# Patient Record
Sex: Female | Born: 1990 | Race: Black or African American | Hispanic: No | Marital: Single | Smoking: Never smoker
Health system: Southern US, Community
[De-identification: ages and names within clinical notes are randomized; demographics above are authoritative.]

## PROBLEM LIST (undated history)

## (undated) DIAGNOSIS — N289 Disorder of kidney and ureter, unspecified: Secondary | ICD-10-CM

## (undated) DIAGNOSIS — J45909 Unspecified asthma, uncomplicated: Secondary | ICD-10-CM

## (undated) HISTORY — PX: BREAST SURGERY: SHX581

---

## 2019-04-30 DIAGNOSIS — J302 Other seasonal allergic rhinitis: Secondary | ICD-10-CM | POA: Insufficient documentation

## 2019-04-30 DIAGNOSIS — G43109 Migraine with aura, not intractable, without status migrainosus: Secondary | ICD-10-CM | POA: Insufficient documentation

## 2019-04-30 DIAGNOSIS — J4520 Mild intermittent asthma, uncomplicated: Secondary | ICD-10-CM | POA: Insufficient documentation

## 2019-12-24 DIAGNOSIS — L309 Dermatitis, unspecified: Secondary | ICD-10-CM | POA: Insufficient documentation

## 2021-02-17 DIAGNOSIS — R002 Palpitations: Secondary | ICD-10-CM | POA: Insufficient documentation

## 2021-02-17 DIAGNOSIS — R030 Elevated blood-pressure reading, without diagnosis of hypertension: Secondary | ICD-10-CM | POA: Insufficient documentation

## 2021-02-17 HISTORY — DX: Elevated blood-pressure reading, without diagnosis of hypertension: R03.0

## 2021-05-23 ENCOUNTER — Emergency Department (HOSPITAL_COMMUNITY)
Admission: EM | Admit: 2021-05-23 | Discharge: 2021-05-23 | Disposition: A | Discharge status: Home / Self Care | Payer: No Typology Code available for payment source | Attending: Emergency Medicine | Admitting: Emergency Medicine

## 2021-05-23 ENCOUNTER — Encounter (HOSPITAL_COMMUNITY): Payer: Self-pay

## 2021-05-23 ENCOUNTER — Other Ambulatory Visit: Payer: Self-pay

## 2021-05-23 ENCOUNTER — Emergency Department (HOSPITAL_COMMUNITY): Payer: No Typology Code available for payment source

## 2021-05-23 ENCOUNTER — Other Ambulatory Visit (HOSPITAL_COMMUNITY): Payer: Self-pay

## 2021-05-23 DIAGNOSIS — N9489 Other specified conditions associated with female genital organs and menstrual cycle: Secondary | ICD-10-CM | POA: Diagnosis not present

## 2021-05-23 DIAGNOSIS — R109 Unspecified abdominal pain: Secondary | ICD-10-CM

## 2021-05-23 DIAGNOSIS — R1030 Lower abdominal pain, unspecified: Secondary | ICD-10-CM | POA: Insufficient documentation

## 2021-05-23 LAB — CBC WITH DIFFERENTIAL/PLATELET
Abs Immature Granulocytes: 0.03 10*3/uL (ref 0.00–0.07)
Basophils Absolute: 0 10*3/uL (ref 0.0–0.1)
Basophils Relative: 1 %
Eosinophils Absolute: 0.3 10*3/uL (ref 0.0–0.5)
Eosinophils Relative: 4 %
HCT: 40.4 % (ref 36.0–46.0)
Hemoglobin: 13.3 g/dL (ref 12.0–15.0)
Immature Granulocytes: 0 %
Lymphocytes Relative: 30 %
Lymphs Abs: 2.4 10*3/uL (ref 0.7–4.0)
MCH: 29.5 pg (ref 26.0–34.0)
MCHC: 32.9 g/dL (ref 30.0–36.0)
MCV: 89.6 fL (ref 80.0–100.0)
Monocytes Absolute: 0.6 10*3/uL (ref 0.1–1.0)
Monocytes Relative: 8 %
Neutro Abs: 4.6 10*3/uL (ref 1.7–7.7)
Neutrophils Relative %: 57 %
Platelets: 283 10*3/uL (ref 150–400)
RBC: 4.51 MIL/uL (ref 3.87–5.11)
RDW: 13.2 % (ref 11.5–15.5)
WBC: 8 10*3/uL (ref 4.0–10.5)
nRBC: 0 % (ref 0.0–0.2)

## 2021-05-23 LAB — COMPREHENSIVE METABOLIC PANEL
ALT: 17 U/L (ref 0–44)
AST: 20 U/L (ref 15–41)
Albumin: 4.4 g/dL (ref 3.5–5.0)
Alkaline Phosphatase: 46 U/L (ref 38–126)
Anion gap: 8 (ref 5–15)
BUN: 9 mg/dL (ref 6–20)
CO2: 26 mmol/L (ref 22–32)
Calcium: 9.2 mg/dL (ref 8.9–10.3)
Chloride: 106 mmol/L (ref 98–111)
Creatinine, Ser: 0.71 mg/dL (ref 0.44–1.00)
GFR, Estimated: >60 mL/min (ref >60)
Glucose, Bld: 102 mg/dL — ABNORMAL HIGH (ref 70–99)
Potassium: 3.9 mmol/L (ref 3.5–5.1)
Sodium: 140 mmol/L (ref 135–145)
Total Bilirubin: 0.6 mg/dL (ref 0.3–1.2)
Total Protein: 7.7 g/dL (ref 6.5–8.1)

## 2021-05-23 LAB — URINALYSIS, ROUTINE W REFLEX MICROSCOPIC
Bilirubin Urine: NEGATIVE
Glucose, UA: NEGATIVE mg/dL
Hgb urine dipstick: NEGATIVE
Ketones, ur: NEGATIVE mg/dL
Leukocytes,Ua: NEGATIVE
Nitrite: NEGATIVE
Protein, ur: NEGATIVE mg/dL
Specific Gravity, Urine: 1.01 (ref 1.005–1.030)
pH: 7 (ref 5.0–8.0)

## 2021-05-23 LAB — HCG, QUANTITATIVE, PREGNANCY: hCG, Beta Chain, Quant, S: <1 m[IU]/mL (ref <5)

## 2021-05-23 LAB — LIPASE, BLOOD: Lipase: 32 U/L (ref 11–51)

## 2021-05-23 LAB — I-STAT BETA HCG BLOOD, ED (MC, WL, AP ONLY): I-stat hCG, quantitative: 9.2 m[IU]/mL — ABNORMAL HIGH (ref <5)

## 2021-05-23 LAB — PREGNANCY, URINE: Preg Test, Ur: NEGATIVE

## 2021-05-23 MED ORDER — SODIUM CHLORIDE 0.9 % IV SOLN
12.5000 mg | Freq: Four times a day (QID) | INTRAVENOUS | Status: DC | PRN
  Administered 2021-05-23: 12.5 mg via INTRAVENOUS
  Filled 2021-05-23 (×2): qty 0.5

## 2021-05-23 MED ORDER — MORPHINE SULFATE (PF) 4 MG/ML IV SOLN
4.0000 mg | Freq: Once | INTRAVENOUS | Status: AC
  Administered 2021-05-23: 4 mg via INTRAVENOUS
  Filled 2021-05-23: qty 1

## 2021-05-23 MED ORDER — IOHEXOL 350 MG/ML SOLN
100.0000 mL | Freq: Once | INTRAVENOUS | Status: AC | PRN
  Administered 2021-05-23: 75 mL via INTRAVENOUS

## 2021-05-23 MED ORDER — PROMETHAZINE HCL 25 MG PO TABS
25.0000 mg | ORAL_TABLET | Freq: Four times a day (QID) | ORAL | 0 refills | Status: DC | PRN
  Filled 2021-05-23: qty 5, 2d supply, fill #0

## 2021-05-23 MED ORDER — ONDANSETRON HCL 4 MG/2ML IJ SOLN
4.0000 mg | Freq: Once | INTRAMUSCULAR | Status: DC
  Filled 2021-05-23: qty 2

## 2021-05-23 MED ORDER — NAPROXEN 500 MG PO TABS
500.0000 mg | ORAL_TABLET | Freq: Two times a day (BID) | ORAL | 0 refills | Status: DC
  Filled 2021-05-23: qty 30, 15d supply, fill #0

## 2021-05-23 NOTE — ED Triage Notes (Signed)
Pt complains of sharp abdominal pain x 3 days.

## 2021-05-23 NOTE — ED Notes (Signed)
Pt urine specimen/UA obtained, placed in triage urine bin, holding for order. 

## 2021-05-23 NOTE — Discharge Instructions (Addendum)
Take medications as needed. Follow-up with your primary care provider. You have a small kidney stone in your kidney that may cause pain in the future. Return to the ER for worsening pain, bloody stools, continued vomiting.

## 2021-05-23 NOTE — ED Provider Notes (Signed)
[facility]ENCY [facility]PT Provider Note   CSN: [id number] Arrival date & time: 05/23/21  0042     History Chief Complaint  Patient presents with   Abdominal Pain    Alyssa Stokes is a 30 y.o. female.  The history is provided by the patient. No language interpreter was used.  Abdominal Pain  30 year old female who presents for evaluation of abdominal pain patient report for the past 3 days she has been experiencing pain to her lower abdomen.  Pain is described as a sharp sensation across her abdomen radiates up toward and to her back.  Pain at times very intense which makes her nauseous.  At this time pain is moderate to severe nothing seems make it better or worse.  No report of any fever chills chest pain shortness of breath productive cough patient mention she has not been sexually active for nearly 4 months.  Her period is irregular.  She is unable to recall last menstruation.  History reviewed. No pertinent past medical history.  There are no problems to display for this patient.   History reviewed. No pertinent surgical history.   OB History   No obstetric history on file.     History reviewed. No pertinent family history.     Home Medications Prior to Admission medications   Not on File    Allergies    Patient has no allergy information on record.  Review of Systems   Review of Systems  Gastrointestinal:  Positive for abdominal pain.  All other systems reviewed and are negative.  Physical Exam Updated Vital Signs BP (!) 149/98 (BP Location: Left Arm)   Pulse 99   Temp 98.1 F (36.7 C) (Oral)   Resp 18   Ht 5\' 6"  (1.676 m)   Wt 66.7 kg   SpO2 100%   BMI 23.73 kg/m   Physical Exam Vitals and nursing note reviewed.  Constitutional:      Appearance: She is well-developed.     Comments: Patient is tearful and looking uncomfortable  HENT:     Head: Atraumatic.  Eyes:     Conjunctiva/sclera: Conjunctivae normal.   Cardiovascular:     Rate and Rhythm: Normal rate and regular rhythm.  Pulmonary:     Effort: Pulmonary effort is normal.  Abdominal:     General: Abdomen is flat. Bowel sounds are normal.     Palpations: Abdomen is soft.     Tenderness: There is abdominal tenderness.  Musculoskeletal:     Cervical back: Neck supple.  Skin:    Findings: No rash.  Neurological:     Mental Status: She is alert.  Psychiatric:        Mood and Affect: Mood normal.    ED Results / Procedures / Treatments   Labs (all labs ordered are listed, but only abnormal results are displayed) Labs Reviewed  COMPREHENSIVE METABOLIC PANEL - Abnormal; Notable for the following components:      Result Value   Glucose, Bld 102 (*)    All other components within normal limits  URINALYSIS, ROUTINE W REFLEX MICROSCOPIC - Abnormal; Notable for the following components:   Color, Urine STRAW (*)    All other components within normal limits  I-STAT BETA HCG BLOOD, ED ([facility], [facility], [facility] ONLY) - Abnormal; Notable for the following components:   I-stat hCG, quantitative 9.2 (*)    All other components within normal limits  CBC WITH DIFFERENTIAL/PLATELET  LIPASE, BLOOD  HCG, QUANTITATIVE, PREGNANCY  EKG None  Radiology No results found.  Procedures Procedures   Medications Ordered in ED Medications  morphine 4 MG/ML injection 4 mg (has no administration in time range)  promethazine (PHENERGAN) 12.5 mg in sodium chloride 0.9 % 50 mL IVPB (has no administration in time range)    ED Course  I have reviewed the triage vital signs and the nursing notes.  Pertinent labs & imaging results that were available during my care of the patient were reviewed by me and considered in my medical decision making (see chart for details).    MDM Rules/Calculators/A&P                           BP (!) 131/92   Pulse 83   Temp 98 F (36.7 C) (Oral)   Resp 19   Ht 5\' 6"  (1.676 m)   Wt 66.7 kg   SpO2 100%   BMI 23.73 kg/m    Final Clinical Impression(s) / ED Diagnoses Final diagnoses:  None    Rx / DC Orders ED Discharge Orders     None      Patient endorsed progressive worsening lower abdominal pain for the past 3 days.  She appears uncomfortable, and does have reproducible abdominal tenderness on exam.  No CVA tenderness to suggest kidney stone.  Unsure last pregnancy status therefore it would be prudent to check U-preg  6:00 AM I-STAT beta-hCG is elevated at 9.2.  Since patient states she is not sexually active, will get confirmatory pregnancy test. Patient is still feeling uncomfortable and nauseous.  She is unable to tolerate Zofran due to prior allergic reaction.  We will give Phenergan.  If her pregnancy test is negative and her symptoms still persist, she may benefit from an abdominal pelvis CT scan for further evaluation to rule appendicitis.  6:49 AM Pt sign out to oncoming provider who will f/u on lab result, reassess and determine the need for additional imaging.    , PA-C 05/23/21 07/23/21    [id number], MD 05/23/21 [phone]

## 2021-05-23 NOTE — ED Provider Notes (Signed)
  Physical Exam  BP 123/79   Pulse 84   Temp 98 F (36.7 C) (Oral)   Resp 15   Ht 5\' 6"  (1.676 m)   Wt 66.7 kg   SpO2 100%   BMI 23.73 kg/m   Physical Exam Vitals and nursing note reviewed.  Constitutional:      General: She is not in acute distress.    Appearance: She is well-developed. She is not diaphoretic.  HENT:     Head: Normocephalic and atraumatic.  Eyes:     General: No scleral icterus.    Conjunctiva/sclera: Conjunctivae normal.  Pulmonary:     Effort: Pulmonary effort is normal. No respiratory distress.  Musculoskeletal:     Cervical back: Normal range of motion.  Skin:    Findings: No rash.  Neurological:     Mental Status: She is alert.    ED Course/Procedures   Clinical Course as of 05/23/21 1036  Tue May 23, 2021  0856 I-stat hCG, quantitative(!): 9.2 [HK]  0856 Preg Test, Ur: NEGATIVE [HK]  0856 CT tech states that we will need a negative lab hCG to verify before she can get a CT of her abdomen pelvis. [HK]  0856 HCG, Beta Chain, Quant, S: <1 [HK]    Clinical Course User Index [HK] May 25, 2021, PA-C    Procedures  MDM  Care handed off from previous provider.  30 year old female presenting to the ED for lower abdominal pain.  She is pending a CT of the abdomen pelvis to rule out structural cause.  10:31 AM CT is negative for acute abnormality.  Will discharge home with symptomatic treatment.  Improved here.  Remains hemodynamically stable.  Return precautions given.    Patient is hemodynamically stable, in NAD, and able to ambulate in the ED. Evaluation does not show pathology that would require ongoing emergent intervention or inpatient treatment. I explained the diagnosis to the patient. Pain has been managed and has no complaints prior to discharge. Patient is comfortable with above plan and is stable for discharge at this time. All questions were answered prior to disposition. Strict return precautions for returning to the ED were discussed.  Encouraged follow up with PCP.   An After Visit Summary was printed and given to the patient.   Portions of this note were generated with 38. Dictation errors may occur despite best attempts at proofreading.     Scientist, clinical (histocompatibility and immunogenetics), PA-C 05/23/21 1036    07/23/21, MD 05/28/21 [phone]

## 2021-05-23 NOTE — ED Notes (Signed)
Pt ambulatory in ED lobby. 

## 2021-05-24 DIAGNOSIS — Z87442 Personal history of urinary calculi: Secondary | ICD-10-CM | POA: Insufficient documentation

## 2021-06-15 ENCOUNTER — Ambulatory Visit (HOSPITAL_COMMUNITY)
Admission: EM | Admit: 2021-06-15 | Discharge: 2021-06-15 | Disposition: A | Discharge status: Home / Self Care | Payer: No Typology Code available for payment source | Attending: Physician Assistant | Admitting: Physician Assistant

## 2021-06-15 ENCOUNTER — Other Ambulatory Visit: Payer: Self-pay

## 2021-06-15 ENCOUNTER — Encounter (HOSPITAL_COMMUNITY): Payer: Self-pay

## 2021-06-15 DIAGNOSIS — R102 Pelvic and perineal pain: Secondary | ICD-10-CM

## 2021-06-15 DIAGNOSIS — R1030 Lower abdominal pain, unspecified: Secondary | ICD-10-CM | POA: Diagnosis not present

## 2021-06-15 LAB — POCT URINALYSIS DIPSTICK, ED / UC
Glucose, UA: 250 mg/dL — AB
Hgb urine dipstick: NEGATIVE
Ketones, ur: 15 mg/dL — AB
Nitrite: POSITIVE — AB
Protein, ur: 100 mg/dL — AB
Specific Gravity, Urine: 1.015 (ref 1.005–1.030)
Urobilinogen, UA: 4 mg/dL — ABNORMAL HIGH (ref 0.0–1.0)
pH: 5 (ref 5.0–8.0)

## 2021-06-15 LAB — POC URINE PREG, ED: Preg Test, Ur: NEGATIVE

## 2021-06-15 MED ORDER — SULFAMETHOXAZOLE-TRIMETHOPRIM 800-160 MG PO TABS: 1.0000 | ORAL_TABLET | Freq: Two times a day (BID) | ORAL | 0 refills | Status: AC

## 2021-06-15 MED ORDER — OXYBUTYNIN CHLORIDE 5 MG PO TABS: 5.0000 mg | ORAL_TABLET | Freq: Every day | ORAL | 0 refills | Status: DC | PRN

## 2021-06-15 NOTE — Discharge Instructions (Addendum)
We will contact you if we need to arrange any additional treatment based on your urine culture and swab result.  Please start Bactrim DS to treat urinary tract infection.  If you develop any oral lesions or rash please stop the medication and be seen immediately.  You can use oxybutynin for bladder spasms but be careful not to use this too regularly as it can cause urinary retention.  Make sure you are drinking plenty of fluid.  If you have any worsening symptoms you need to go to the emergency room for imaging as we discussed.  Follow-up with urology as scheduled.

## 2021-06-15 NOTE — ED Triage Notes (Signed)
Pt presents with lower abdominal pain x 08/09. States she has had ultrasounds and scans. States nothing has resolved. States her bladder feels heavy and states she trouble urinating. States she got off her Nexplanon since June and states she has not had a cycle since then.

## 2021-06-15 NOTE — ED Provider Notes (Signed)
[facility]    CSN: [id number] Arrival date & time: 06/15/21  1935      History   Chief Complaint Chief Complaint  Patient presents with   Pelvic Pain    HPI Alyssa Stokes is a 30 y.o. female.   Patient presents today with a several week history of ongoing lower abdominal pain.  She was seen in the emergency room on 05/23/2021 at which point CT scan showed no acute abnormality to explain patient's pain.  She then contacted her primary care provider who prescribed Keflex to cover for UTI.  She had also been taking Azo and reports that symptoms did improve with regular Azo use but since she stopped this medication her symptoms recurred.  Pain is rated 10 on a 0-10 pain scale, localized to lower abdomen, described as intense pressure, worse with ambulation or micturition, no alleviating factors identified.  She denies any dysuria but does report urinary frequency and urgency.  She has not been sexually active in the past few months but has not been tested for STIs and is open to this today.  She denies any vaginal symptoms including bleeding, discharge, increased pain.  She is scheduled to see a urologist in approximately a week but has not seen specialist at this time.  She has a history of nephrolithiasis but states current symptoms are not similar to previous episodes of this condition.   History reviewed. No pertinent past medical history.  There are no problems to display for this patient.   History reviewed. No pertinent surgical history.  OB History   No obstetric history on file.      Home Medications    Prior to Admission medications   Medication Sig Start Date End Date Taking? Authorizing Provider  oxybutynin (DITROPAN) 5 MG tablet Take 1 tablet (5 mg total) by mouth daily as needed for bladder spasms. 06/15/21  Yes Cora Stetson K, PA-C  sulfamethoxazole-trimethoprim (BACTRIM DS) 800-160 MG tablet Take 1 tablet by mouth 2 (two) times daily for 7  days. 06/15/21 06/22/21 Yes Raina Sole, Noberto Retort, PA-C  acetaminophen (TYLENOL) 500 MG tablet Take 1,000 mg by mouth every 6 (six) hours as needed for mild pain.    [provider]  albuterol (VENTOLIN HFA) 108 (90 Base) MCG/ACT inhaler Inhale 2 puffs into the lungs every 6 (six) hours as needed for wheezing or shortness of breath.    [provider]  cetirizine (ZYRTEC) 10 MG tablet Take 10 mg by mouth daily.    [provider]  ibuprofen (ADVIL) 200 MG tablet Take 600 mg by mouth every 6 (six) hours as needed for mild pain.    [provider]  naproxen (NAPROSYN) 500 MG tablet Take 1 tablet by mouth 2 times daily. 05/23/21   Khatri, Hina, PA-C  promethazine (PHENERGAN) 25 MG tablet Take 1 tablet  by mouth every 6  hours as needed for nausea or vomiting. 05/23/21   Dietrich Pates, PA-C    Family History History reviewed. No pertinent family history.  Social History Social History   Tobacco Use   Smoking status: Never   Smokeless tobacco: Never  Vaping Use   Vaping Use: Never used  Substance Use Topics   Alcohol use: Yes   Drug use: Never     Allergies   Ondansetron, Avocado, Banana, and Benadryl [diphenhydramine]   Review of Systems Review of Systems  Constitutional:  Negative for activity change, appetite change, fatigue and fever.  Respiratory:  Negative for cough  and shortness of breath.   Cardiovascular:  Negative for chest pain.  Gastrointestinal:  Positive for abdominal pain. Negative for diarrhea, nausea and vomiting.  Genitourinary:  Positive for frequency, pelvic pain and urgency. Negative for dysuria, vaginal bleeding, vaginal discharge and vaginal pain.  Neurological:  Negative for dizziness, light-headedness and headaches.    Physical Exam Triage Vital Signs ED Triage Vitals  Enc Vitals Group     BP 06/15/21 2010 134/78     Pulse Rate 06/15/21 2009 91     Resp 06/15/21 2009 19     Temp 06/15/21 2009 98.1 F (36.7 C)     Temp Source  06/15/21 2009 Oral     SpO2 06/15/21 2009 99 %     Weight --      Height --      Head Circumference --      Peak Flow --      Pain Score 06/15/21 2007 10     Pain Loc --      Pain Edu? --      Excl. in GC? --    No data found.  Updated Vital Signs BP 134/78   Pulse 91   Temp 98.1 F (36.7 C) (Oral)   Resp 19   LMP  (LMP Unknown)   SpO2 99%   Visual Acuity Right Eye Distance:   Left Eye Distance:   Bilateral Distance:    Right Eye Near:   Left Eye Near:    Bilateral Near:     Physical Exam Vitals reviewed. Exam conducted with a chaperone present.  Constitutional:      General: She is awake. She is not in acute distress.    Appearance: Normal appearance. She is normal weight. She is not ill-appearing.     Comments: Very pleasant female appears stated age in no acute distress sitting comfortably in exam room  HENT:     Head: Normocephalic and atraumatic.  Cardiovascular:     Rate and Rhythm: Normal rate and regular rhythm.     Heart sounds: Normal heart sounds, S1 normal and S2 normal. No murmur heard. Pulmonary:     Effort: Pulmonary effort is normal.     Breath sounds: Normal breath sounds. No wheezing, rhonchi or rales.     Comments: Clear to auscultation bilaterally Abdominal:     General: Bowel sounds are normal.     Palpations: Abdomen is soft.     Tenderness: There is abdominal tenderness in the right lower quadrant, suprapubic area and left lower quadrant. There is no right CVA tenderness, left CVA tenderness, guarding or rebound.     Comments: Significant tenderness to palpation throughout lower abdomen.   Genitourinary:    Labia:        Right: No rash or tenderness.        Left: No rash or tenderness.      Vagina: Normal.     Cervix: No cervical motion tenderness.     Uterus: Normal.      Comments: [name], RN present as chaperone during exam.  No cervical motion tenderness or evidence of PID on exam. Psychiatric:        Behavior: Behavior is  cooperative.     UC Treatments / Results  Labs (all labs ordered are listed, but only abnormal results are displayed) Labs Reviewed  POCT URINALYSIS DIPSTICK, ED / UC - Abnormal; Notable for the following components:      Result Value   Glucose, UA 250 (*)  Bilirubin Urine SMALL (*)    Ketones, ur 15 (*)    Protein, ur 100 (*)    Urobilinogen, UA 4.0 (*)    Nitrite POSITIVE (*)    Leukocytes,Ua LARGE (*)    All other components within normal limits  URINE CULTURE  POC URINE PREG, ED  CERVICOVAGINAL ANCILLARY ONLY    EKG   Radiology No results found.  Procedures Procedures (including critical care time)  Medications Ordered in UC Medications - No data to display  Initial Impression / Assessment and Plan / UC Course  I have reviewed the triage vital signs and the nursing notes.  Pertinent labs & imaging results that were available during my care of the patient were reviewed by me and considered in my medical decision making (see chart for details).      Discussed with patient that we do not have capabilities for imaging including ultrasound or CT in clinic.  Patient is understanding going to the emergency room as she has already been there and had negative CT scan.  Discussed that if symptoms persist or worsen she should go to the emergency room for imaging to which she expressed understanding.  UA was unable to read due to recent Azo use but will empirically treat for urinary tract infection with Bactrim DS.  Urine culture was obtained and we discussed potential need to change antibiotics based on susceptibilities identified on culture.  STI swab was collected-results pending.  No evidence of PID on pelvic exam so we will defer additional antibiotic treatment for the time being.  Urine pregnancy was negative.  Patient reports that her pain is worse with micturition and she feels intense bladder spasms; she has failed Azo so we will try very low-dose oxybutynin but we  discussed potential risk of urinary retention and encouraged her to be evaluated immediately with any decreased urination.  She is scheduled to see urology next week, strongly encouraged to keep this appointment.  We discussed that if she has any worsening symptoms in the meantime she is to go to the emergency room.  Strict return precautions given to which patient expressed understanding.  Final Clinical Impressions(s) / UC Diagnoses   Final diagnoses:  Lower abdominal pain  Pelvic pain     Discharge Instructions      We will contact you if we need to arrange any additional treatment based on your urine culture and swab result.  Please start Bactrim DS to treat urinary tract infection.  If you develop any oral lesions or rash please stop the medication and be seen immediately.  You can use oxybutynin for bladder spasms but be careful not to use this too regularly as it can cause urinary retention.  Make sure you are drinking plenty of fluid.  If you have any worsening symptoms you need to go to the emergency room for imaging as we discussed.  Follow-up with urology as scheduled.     ED Prescriptions     Medication Sig Dispense Auth. Provider   sulfamethoxazole-trimethoprim (BACTRIM DS) 800-160 MG tablet Take 1 tablet by mouth 2 (two) times daily for 7 days. 14 tablet Aeon Kessner K, PA-C   oxybutynin (DITROPAN) 5 MG tablet Take 1 tablet (5 mg total) by mouth daily as needed for bladder spasms. 10 tablet Elio Haden, Noberto Retort, PA-C      PDMP not reviewed this encounter.   Jeani Hawking, PA-C 06/15/21 2053

## 2021-06-16 ENCOUNTER — Other Ambulatory Visit: Payer: Self-pay

## 2021-06-16 ENCOUNTER — Emergency Department (HOSPITAL_COMMUNITY): Payer: No Typology Code available for payment source

## 2021-06-16 ENCOUNTER — Encounter (HOSPITAL_COMMUNITY): Payer: Self-pay

## 2021-06-16 ENCOUNTER — Emergency Department (HOSPITAL_COMMUNITY)
Admission: EM | Admit: 2021-06-16 | Discharge: 2021-06-16 | Disposition: A | Discharge status: Home / Self Care | Payer: No Typology Code available for payment source | Attending: Emergency Medicine | Admitting: Emergency Medicine

## 2021-06-16 ENCOUNTER — Telehealth (HOSPITAL_COMMUNITY): Payer: Self-pay

## 2021-06-16 DIAGNOSIS — B9689 Other specified bacterial agents as the cause of diseases classified elsewhere: Secondary | ICD-10-CM | POA: Diagnosis not present

## 2021-06-16 DIAGNOSIS — R1032 Left lower quadrant pain: Secondary | ICD-10-CM | POA: Diagnosis not present

## 2021-06-16 DIAGNOSIS — N390 Urinary tract infection, site not specified: Secondary | ICD-10-CM | POA: Diagnosis not present

## 2021-06-16 DIAGNOSIS — R1031 Right lower quadrant pain: Secondary | ICD-10-CM | POA: Insufficient documentation

## 2021-06-16 DIAGNOSIS — R300 Dysuria: Secondary | ICD-10-CM | POA: Diagnosis present

## 2021-06-16 DIAGNOSIS — J45909 Unspecified asthma, uncomplicated: Secondary | ICD-10-CM | POA: Insufficient documentation

## 2021-06-16 HISTORY — DX: Unspecified asthma, uncomplicated: J45.909

## 2021-06-16 HISTORY — DX: Disorder of kidney and ureter, unspecified: N28.9

## 2021-06-16 LAB — COMPREHENSIVE METABOLIC PANEL
ALT: 17 U/L (ref 0–44)
AST: 17 U/L (ref 15–41)
Albumin: 4.1 g/dL (ref 3.5–5.0)
Alkaline Phosphatase: 38 U/L (ref 38–126)
Anion gap: 5 (ref 5–15)
BUN: 7 mg/dL (ref 6–20)
CO2: 25 mmol/L (ref 22–32)
Calcium: 9.1 mg/dL (ref 8.9–10.3)
Chloride: 108 mmol/L (ref 98–111)
Creatinine, Ser: 0.74 mg/dL (ref 0.44–1.00)
GFR, Estimated: >60 mL/min (ref >60)
Glucose, Bld: 92 mg/dL (ref 70–99)
Potassium: 4.2 mmol/L (ref 3.5–5.1)
Sodium: 138 mmol/L (ref 135–145)
Total Bilirubin: 0.4 mg/dL (ref 0.3–1.2)
Total Protein: 7.4 g/dL (ref 6.5–8.1)

## 2021-06-16 LAB — CBC WITH DIFFERENTIAL/PLATELET
Abs Immature Granulocytes: 0.01 10*3/uL (ref 0.00–0.07)
Basophils Absolute: 0 10*3/uL (ref 0.0–0.1)
Basophils Relative: 0 %
Eosinophils Absolute: 0.3 10*3/uL (ref 0.0–0.5)
Eosinophils Relative: 4 %
HCT: 36.1 % (ref 36.0–46.0)
Hemoglobin: 11.8 g/dL — ABNORMAL LOW (ref 12.0–15.0)
Immature Granulocytes: 0 %
Lymphocytes Relative: 24 %
Lymphs Abs: 1.8 10*3/uL (ref 0.7–4.0)
MCH: 29.2 pg (ref 26.0–34.0)
MCHC: 32.7 g/dL (ref 30.0–36.0)
MCV: 89.4 fL (ref 80.0–100.0)
Monocytes Absolute: 0.7 10*3/uL (ref 0.1–1.0)
Monocytes Relative: 9 %
Neutro Abs: 4.7 10*3/uL (ref 1.7–7.7)
Neutrophils Relative %: 63 %
Platelets: 275 10*3/uL (ref 150–400)
RBC: 4.04 MIL/uL (ref 3.87–5.11)
RDW: 14 % (ref 11.5–15.5)
WBC: 7.5 10*3/uL (ref 4.0–10.5)
nRBC: 0 % (ref 0.0–0.2)

## 2021-06-16 LAB — CERVICOVAGINAL ANCILLARY ONLY
Bacterial Vaginitis (gardnerella): NEGATIVE
Candida Glabrata: NEGATIVE
Candida Vaginitis: NEGATIVE
Chlamydia: NEGATIVE
Comment: NEGATIVE
Comment: NEGATIVE
Comment: NEGATIVE
Comment: NEGATIVE
Comment: NEGATIVE
Comment: NORMAL
Neisseria Gonorrhea: NEGATIVE
Trichomonas: NEGATIVE

## 2021-06-16 LAB — URINALYSIS, ROUTINE W REFLEX MICROSCOPIC
Bilirubin Urine: NEGATIVE
Glucose, UA: NEGATIVE mg/dL
Ketones, ur: NEGATIVE mg/dL
Nitrite: NEGATIVE
Protein, ur: NEGATIVE mg/dL
Specific Gravity, Urine: <1.005 — ABNORMAL LOW (ref 1.005–1.030)
pH: 6 (ref 5.0–8.0)

## 2021-06-16 LAB — I-STAT BETA HCG BLOOD, ED (MC, WL, AP ONLY): I-stat hCG, quantitative: <5 m[IU]/mL (ref <5)

## 2021-06-16 LAB — LIPASE, BLOOD: Lipase: 28 U/L (ref 11–51)

## 2021-06-16 MED ORDER — KETOROLAC TROMETHAMINE 15 MG/ML IJ SOLN
15.0000 mg | Freq: Once | INTRAMUSCULAR | Status: AC
  Administered 2021-06-16: 15 mg via INTRAVENOUS
  Filled 2021-06-16: qty 1

## 2021-06-16 NOTE — Telephone Encounter (Signed)
Pt was informed she can keep using the Bactrim and we are still waiting for the urine culture.

## 2021-06-16 NOTE — ED Provider Notes (Signed)
[facility]-EMERGENCY DEPT Provider Note   CSN: [id number] Arrival date & time: 06/16/21  2376     History Chief Complaint  Patient presents with   Abdominal Pain    Alyssa Stokes is a 30 y.o. female.   Abdominal Pain Pain location:  RLQ, LLQ and suprapubic Pain quality: aching   Pain radiates to:  Does not radiate Pain severity:  Mild Onset quality:  Gradual Duration:  1 week Timing:  Intermittent Progression:  Waxing and waning Chronicity:  New Context comment:  Hx of kidney stone, dx with UTI yesterday on abx but ongoing pain Relieved by:  Nothing Worsened by:  Nothing Associated symptoms: dysuria   Associated symptoms: no chest pain, no chills, no cough, no fever, no hematuria, no shortness of breath, no sore throat, no vaginal bleeding, no vaginal discharge and no vomiting   Risk factors: has not had multiple surgeries       Past Medical History:  Diagnosis Date   Asthma    Renal disorder     There are no problems to display for this patient.   Past Surgical History:  Procedure Laterality Date   BREAST SURGERY       OB History   No obstetric history on file.     Family History  Problem Relation Age of Onset   Healthy Mother    Healthy Father     Social History   Tobacco Use   Smoking status: Never   Smokeless tobacco: Never  Vaping Use   Vaping Use: Never used  Substance Use Topics   Alcohol use: Yes   Drug use: Never    Home Medications Prior to Admission medications   Medication Sig Start Date End Date Taking? Authorizing Provider  acetaminophen (TYLENOL) 500 MG tablet Take 1,000 mg by mouth every 6 (six) hours as needed for mild pain.    [provider]  albuterol (VENTOLIN HFA) 108 (90 Base) MCG/ACT inhaler Inhale 2 puffs into the lungs every 6 (six) hours as needed for wheezing or shortness of breath.    [provider]  cetirizine (ZYRTEC) 10 MG tablet Take 10 mg by mouth  daily.    [provider]  ibuprofen (ADVIL) 200 MG tablet Take 600 mg by mouth every 6 (six) hours as needed for mild pain.    [provider]  naproxen (NAPROSYN) 500 MG tablet Take 1 tablet by mouth 2 times daily. 05/23/21   Khatri, Hina, PA-C  oxybutynin (DITROPAN) 5 MG tablet Take 1 tablet (5 mg total) by mouth daily as needed for bladder spasms. 06/15/21   Raspet, Noberto Retort, PA-C  promethazine (PHENERGAN) 25 MG tablet Take 1 tablet  by mouth every 6  hours as needed for nausea or vomiting. 05/23/21   Khatri, Hina, PA-C  sulfamethoxazole-trimethoprim (BACTRIM DS) 800-160 MG tablet Take 1 tablet by mouth 2 (two) times daily for 7 days. 06/15/21 06/22/21  Raspet, Noberto Retort, PA-C    Allergies    Ondansetron, Avocado, Banana, and Benadryl [diphenhydramine]  Review of Systems   Review of Systems  Constitutional:  Negative for chills and fever.  HENT:  Negative for ear pain and sore throat.   Eyes:  Negative for pain and visual disturbance.  Respiratory:  Negative for cough and shortness of breath.   Cardiovascular:  Negative for chest pain and palpitations.  Gastrointestinal:  Positive for abdominal pain. Negative for vomiting.  Genitourinary:  Positive for dysuria. Negative for hematuria, menstrual problem, vaginal bleeding  and vaginal discharge.  Musculoskeletal:  Negative for arthralgias and back pain.  Skin:  Negative for color change and rash.  Neurological:  Negative for seizures and syncope.  All other systems reviewed and are negative.  Physical Exam Updated Vital Signs BP 116/73   Pulse 90   Temp 98.9 F (37.2 C) (Oral)   Resp 17   Ht 5\' 6"  (1.676 m)   Wt 68 kg   LMP  (LMP Unknown)   SpO2 99%   BMI 24.21 kg/m   Physical Exam Vitals and nursing note reviewed.  Constitutional:      General: She is not in acute distress.    Appearance: She is well-developed. She is not ill-appearing.  HENT:     Head: Normocephalic and atraumatic.     Mouth/Throat:     Mouth:  Mucous membranes are moist.  Eyes:     Extraocular Movements: Extraocular movements intact.     Conjunctiva/sclera: Conjunctivae normal.     Pupils: Pupils are equal, round, and reactive to light.  Cardiovascular:     Rate and Rhythm: Normal rate and regular rhythm.     Pulses: Normal pulses.     Heart sounds: Normal heart sounds. No murmur heard. Pulmonary:     Effort: Pulmonary effort is normal. No respiratory distress.     Breath sounds: Normal breath sounds.  Abdominal:     General: Abdomen is flat.     Palpations: Abdomen is soft.     Tenderness: There is abdominal tenderness in the right lower quadrant, suprapubic area and left lower quadrant.     Hernia: No hernia is present.  Musculoskeletal:     Cervical back: Neck supple.  Skin:    General: Skin is warm and dry.     Capillary Refill: Capillary refill takes less than 2 seconds.  Neurological:     General: No focal deficit present.     Mental Status: She is alert.  Psychiatric:        Mood and Affect: Mood normal.    ED Results / Procedures / Treatments   Labs (all labs ordered are listed, but only abnormal results are displayed) Labs Reviewed  URINALYSIS, ROUTINE W REFLEX MICROSCOPIC - Abnormal; Notable for the following components:      Result Value   APPearance CLEAR (*)    Specific Gravity, Urine <1.005 (*)    Hgb urine dipstick TRACE (*)    Leukocytes,Ua TRACE (*)    Bacteria, UA MANY (*)    All other components within normal limits  CBC WITH DIFFERENTIAL/PLATELET - Abnormal; Notable for the following components:   Hemoglobin 11.8 (*)    All other components within normal limits  URINE CULTURE  COMPREHENSIVE METABOLIC PANEL  LIPASE, BLOOD  I-STAT BETA HCG BLOOD, ED ([facility], [facility], [facility] ONLY)    EKG None  Radiology CT Renal Stone Study  Result Date: 06/16/2021 CLINICAL DATA:  Acute lower abdominal pain. EXAM: CT ABDOMEN AND PELVIS WITHOUT CONTRAST TECHNIQUE: Multidetector CT imaging of the abdomen and pelvis  was performed following the standard protocol without IV contrast. COMPARISON:  May 23, 2021. FINDINGS: Lower chest: No acute abnormality. Hepatobiliary: No focal liver abnormality is seen. No gallstones, gallbladder wall thickening, or biliary dilatation. Pancreas: Unremarkable. No pancreatic ductal dilatation or surrounding inflammatory changes. Spleen: Normal in size without focal abnormality. Adrenals/Urinary Tract: Adrenal glands appear normal. Small nonobstructive right renal calculus is noted. No hydronephrosis or renal obstruction is noted. Urinary bladder is unremarkable. Stomach/Bowel: Stomach is within  normal limits. Appendix appears normal. No evidence of bowel wall thickening, distention, or inflammatory changes. Vascular/Lymphatic: No significant vascular findings are present. No enlarged abdominal or pelvic lymph nodes. Reproductive: Uterus and bilateral adnexa are unremarkable. Other: No abdominal wall hernia or abnormality. No abdominopelvic ascites. Musculoskeletal: No acute or significant osseous findings. IMPRESSION: Small nonobstructive right renal calculus. No other definite abnormality seen in the abdomen or pelvis. Electronically Signed   By: Lupita Raider M.D.   On: 06/16/2021 11:39    Procedures Procedures   Medications Ordered in ED Medications  ketorolac (TORADOL) 15 MG/ML injection 15 mg (15 mg Intravenous Given 06/16/21 1158)    ED Course  I have reviewed the triage vital signs and the nursing notes.  Pertinent labs & imaging results that were available during my care of the patient were reviewed by me and considered in my medical decision making (see chart for details).    MDM Rules/Calculators/A&P                           Alyssa Stokes is here with lower abdominal pain.  Normal vitals.  No fever.  Started antibiotics yesterday for possible urinary tract infection.  Also on oxybutynin for bladder spasms.  She has had ongoing pain.  History of  kidney stones.  She is only taken 1 dose antibiotic.  She is fairly tender in the lower abdomen.  Suspect that this is still likely related to UTI and bladder spasms we will get a CT scan to rule out appendicitis, diverticulitis, kidney stone other intra-abdominal process such as bowel obstruction.  She is not having any vaginal discharge or vaginal bleeding.  She had pelvic exam yesterday that was unremarkable and cultures were collected.  She has no concern for STDs.  Overall this appears to be an ongoing chronic issue but will get lab work and imaging to rule out any further acute process.  Could be ovarian cyst, seems less likely to be ovarian torsion.  Urinalysis consistent with infection.  Lab work otherwise unremarkable.  No pancreatitis or appendicitis or bowel obstruction seen on CT scan abdomen pelvis.  No ovarian cyst.  Overall suspect UTI with bladder spasms.  She is already on Bactrim and oxybutynin.  Recommend follow-up with urology for chronic UTI/pain.  Could be interstitial cystitis.  Discharged in good condition.  This chart was dictated using voice recognition software.  Despite best efforts to proofread,  errors can occur which can change the documentation meaning.   Final Clinical Impression(s) / ED Diagnoses Final diagnoses:  Lower urinary tract infectious disease    Rx / DC Orders ED Discharge Orders     None        Virgina Norfolk, DO 06/16/21 1204

## 2021-06-16 NOTE — ED Triage Notes (Signed)
Patient c/o lower abdominal pain x 1 week. Patient also c/o urinary frequency, but denies any vaginal discharge.

## 2021-06-17 LAB — URINE CULTURE
Culture: 50000 — AB
Culture: NO GROWTH

## 2021-06-21 ENCOUNTER — Encounter: Payer: Self-pay | Admitting: Urology

## 2021-06-21 ENCOUNTER — Ambulatory Visit (INDEPENDENT_AMBULATORY_CARE_PROVIDER_SITE_OTHER): Payer: No Typology Code available for payment source | Admitting: Urology

## 2021-06-21 ENCOUNTER — Other Ambulatory Visit: Payer: Self-pay

## 2021-06-21 VITALS — BP 149/84 | HR 96 | Ht 66.0 in | Wt 147.5 lb

## 2021-06-21 DIAGNOSIS — N35919 Unspecified urethral stricture, male, unspecified site: Secondary | ICD-10-CM

## 2021-06-21 DIAGNOSIS — R3989 Other symptoms and signs involving the genitourinary system: Secondary | ICD-10-CM

## 2021-06-21 DIAGNOSIS — R3915 Urgency of urination: Secondary | ICD-10-CM | POA: Diagnosis not present

## 2021-06-21 DIAGNOSIS — N3289 Other specified disorders of bladder: Secondary | ICD-10-CM

## 2021-06-21 MED ORDER — GEMTESA 75 MG PO TABS: 75.0000 mg | ORAL_TABLET | Freq: Every day | ORAL | 0 refills | Status: DC

## 2021-06-21 NOTE — Progress Notes (Signed)
06/21/2021 3:41 PM   Alyssa Stokes [birth year] [id number]  Referring provider: No referring provider defined for this encounter.  Chief complaint: Bladder pain   HPI: Alyssa Stokes is a 30 y.o. female who presents for evaluation of pelvic pain.  Initially presented to [facility] ED 05/23/2021 with a 3-day history of lower abdominal pain that radiated to the back region Associated with nausea No identifiable precipitating, aggravating or alleviating factors Urinalysis was clear CT abdomen/pelvis with contrast showed a nonobstructing 4 mm right lower pole calculus She was discharged after having a normal evaluation Although no documentation of voiding complaints at that visit she had a telehealth visit the next day complaining of dysuria and severe spasms at the end of urination.  + Urinary urgency No gross hematuria, fever or chills She was treated with an empiric course of cephalexin and Pyridium with improvement in her symptoms however they returned 1 week later Seen [facility] 06/15/2021 with pelvic pain and worsening symptoms Spasmodic pain bladder/urethral area at the end of urination rated 10/10 No sexual activity in over 5 months Dipstick UA was nitrite positive with large leukocytes and she was treated with Septra DS and immediate release oxybutynin Urine culture returned 50,000 lactobacilli Repeat noncontrast CT showed no significant changes/abnormalities Symptoms have persisted, no real improvement with oxybutynin   PMH: Past Medical History:  Diagnosis Date   Asthma    Renal disorder     Surgical History: Past Surgical History:  Procedure Laterality Date   BREAST SURGERY      Home Medications:  Allergies as of 06/21/2021       Reactions   Ondansetron Hives   Avocado Nausea Only   Tongue tingles   Banana Nausea Only   Tongue tingled   Benadryl [diphenhydramine] Palpitations   IV Bendaryl        Medication List         Accurate as of June 21, 2021  3:41 PM. If you have any questions, ask your nurse or doctor.          acetaminophen 500 MG tablet Commonly known as: TYLENOL Take 1,000 mg by mouth every 6 (six) hours as needed for mild pain.   albuterol 108 (90 Base) MCG/ACT inhaler Commonly known as: VENTOLIN HFA Inhale 2 puffs into the lungs every 6 (six) hours as needed for wheezing or shortness of breath.   cetirizine 10 MG tablet Commonly known as: ZYRTEC Take 10 mg by mouth daily.   ibuprofen 200 MG tablet Commonly known as: ADVIL Take 600 mg by mouth every 6 (six) hours as needed for mild pain.   naproxen 500 MG tablet Commonly known as: NAPROSYN Take 1 tablet by mouth 2 times daily.   oxybutynin 5 MG tablet Commonly known as: DITROPAN Take 1 tablet (5 mg total) by mouth daily as needed for bladder spasms.   promethazine 25 MG tablet Commonly known as: PHENERGAN Take 1 tablet  by mouth every 6  hours as needed for nausea or vomiting.   sulfamethoxazole-trimethoprim 800-160 MG tablet Commonly known as: BACTRIM DS Take 1 tablet by mouth 2 (two) times daily for 7 days.        Allergies:  Allergies  Allergen Reactions   Ondansetron Hives   Avocado Nausea Only    Tongue tingles   Banana Nausea Only    Tongue tingled   Benadryl [Diphenhydramine] Palpitations    IV Bendaryl    Family History: Family History  Problem Relation Age of  Onset   Healthy Mother    Healthy Father     Social History:  reports that she has never smoked. She has never used smokeless tobacco. She reports current alcohol use. She reports that she does not use drugs.   Physical Exam: LMP  (LMP Unknown)   Constitutional:  Alert and oriented, No acute distress. HEENT: [place] AT, moist mucus membranes.  Trachea midline, no masses. Cardiovascular: No clubbing, cyanosis, or edema. Respiratory: Normal respiratory effort, no increased work of breathing. Skin: No rashes, bruises or suspicious  lesions. Neurologic: Grossly intact, no focal deficits, moving all 4 extremities. Psychiatric: Normal mood and affect.  Laboratory Data:  Urinalysis Pending   Pertinent Imaging: CT images were personally reviewed and interpreted  CT Renal Stone Study  Narrative CLINICAL DATA:  Acute lower abdominal pain.  EXAM: CT ABDOMEN AND PELVIS WITHOUT CONTRAST  TECHNIQUE: Multidetector CT imaging of the abdomen and pelvis was performed following the standard protocol without IV contrast.  COMPARISON:  May 23, 2021.  FINDINGS: Lower chest: No acute abnormality.  Hepatobiliary: No focal liver abnormality is seen. No gallstones, gallbladder wall thickening, or biliary dilatation.  Pancreas: Unremarkable. No pancreatic ductal dilatation or surrounding inflammatory changes.  Spleen: Normal in size without focal abnormality.  Adrenals/Urinary Tract: Adrenal glands appear normal. Small nonobstructive right renal calculus is noted. No hydronephrosis or renal obstruction is noted. Urinary bladder is unremarkable.  Stomach/Bowel: Stomach is within normal limits. Appendix appears normal. No evidence of bowel wall thickening, distention, or inflammatory changes.  Vascular/Lymphatic: No significant vascular findings are present. No enlarged abdominal or pelvic lymph nodes.  Reproductive: Uterus and bilateral adnexa are unremarkable.  Other: No abdominal wall hernia or abnormality. No abdominopelvic ascites.  Musculoskeletal: No acute or significant osseous findings.  IMPRESSION: Small nonobstructive right renal calculus.  No other definite abnormality seen in the abdomen or pelvis.   Electronically Signed By: Lupita Raider M.D. On: 06/16/2021 11:39   Assessment & Plan:    1.  Pelvic pain Does not appear to be secondary to an infectious etiology We discussed other noninfectious entities including interstitial cystitis/painful bladder syndrome We discussed  possibility of dietary triggers and she was given a dietary sheet with potential triggers Oxybutynin has not been effective and given samples Gemtesa 75 mg daily She will be contacted with the UA results Follow-up 3 weeks for symptom recheck and if no significant we discussed possibility of scheduling cystoscopy/hydrodistention She would instructed call earlier for worsening symptoms   Riki Altes, MD  [facility] [address] [place], [place] [address] [phone]

## 2021-06-22 LAB — URINALYSIS, COMPLETE
Bilirubin, UA: NEGATIVE
Glucose, UA: NEGATIVE
Ketones, UA: NEGATIVE
Leukocytes,UA: NEGATIVE
Nitrite, UA: NEGATIVE
Protein,UA: NEGATIVE
RBC, UA: NEGATIVE
Specific Gravity, UA: >1.03 — ABNORMAL HIGH (ref 1.005–1.030)
Urobilinogen, Ur: 0.2 mg/dL (ref 0.2–1.0)
pH, UA: 6.5 (ref 5.0–7.5)

## 2021-06-22 LAB — MICROSCOPIC EXAMINATION

## 2021-06-27 ENCOUNTER — Other Ambulatory Visit (HOSPITAL_COMMUNITY): Payer: Self-pay

## 2021-06-27 ENCOUNTER — Encounter (HOSPITAL_BASED_OUTPATIENT_CLINIC_OR_DEPARTMENT_OTHER): Payer: Self-pay | Admitting: Nurse Practitioner

## 2021-06-27 ENCOUNTER — Other Ambulatory Visit: Payer: Self-pay

## 2021-06-27 ENCOUNTER — Other Ambulatory Visit (HOSPITAL_COMMUNITY)
Admission: RE | Admit: 2021-06-27 | Discharge: 2021-06-27 | Disposition: A | Discharge status: Home / Self Care | Payer: No Typology Code available for payment source | Source: Ambulatory Visit | Attending: Nurse Practitioner | Admitting: Nurse Practitioner

## 2021-06-27 ENCOUNTER — Ambulatory Visit (INDEPENDENT_AMBULATORY_CARE_PROVIDER_SITE_OTHER): Payer: No Typology Code available for payment source | Admitting: Nurse Practitioner

## 2021-06-27 ENCOUNTER — Ambulatory Visit: Payer: Self-pay | Admitting: Urology

## 2021-06-27 VITALS — BP 123/78 | HR 92 | Ht 62.0 in | Wt 147.4 lb

## 2021-06-27 DIAGNOSIS — Z0000 Encounter for general adult medical examination without abnormal findings: Secondary | ICD-10-CM

## 2021-06-27 DIAGNOSIS — R1030 Lower abdominal pain, unspecified: Secondary | ICD-10-CM

## 2021-06-27 HISTORY — DX: Encounter for general adult medical examination without abnormal findings: Z00.00

## 2021-06-27 LAB — POCT URINALYSIS DIPSTICK
Bilirubin, UA: NEGATIVE
Blood, UA: NEGATIVE
Glucose, UA: NEGATIVE
Ketones, UA: NEGATIVE
Leukocytes, UA: NEGATIVE
Nitrite, UA: NEGATIVE
Protein, UA: NEGATIVE
Spec Grav, UA: >=1.03 — AB (ref 1.010–1.025)
Urobilinogen, UA: 0.2 E.U./dL
pH, UA: 5.5 (ref 5.0–8.0)

## 2021-06-27 MED ORDER — ALBUTEROL SULFATE HFA 108 (90 BASE) MCG/ACT IN AERS
2.0000 | INHALATION_SPRAY | Freq: Four times a day (QID) | RESPIRATORY_TRACT | 3 refills | Status: DC | PRN
  Filled 2021-06-27: qty 8.5, 25d supply, fill #0
  Filled 2021-10-02: qty 8.5, 25d supply, fill #1
  Filled 2021-12-20: qty 8.5, 25d supply, fill #2
  Filled 2022-01-19: qty 8.5, 25d supply, fill #3

## 2021-06-27 NOTE — Assessment & Plan Note (Signed)
Suprapubic pain present for 6-8 weeks Abnormal pap with colopo performed about 6 weeks ago with normal findings. IUD removed at that time. No menses since that time.  CMT present with friability to cervix. Tenderness present to the right adnexa. UA negative today. Will send sureswab for further evaluation with pelvic and transvaginal US

## 2021-06-27 NOTE — Assessment & Plan Note (Signed)
Review of current and past medical history, social history, medication, and family history.  Review of care gaps and health maintenance recommendations.  Records from recent providers to be requested if not available in Chart Review or Care Everywhere.  Recommendations for health maintenance, diet, and exercise provided.   

## 2021-06-27 NOTE — Progress Notes (Signed)
Alyssa Render, DNP, AGNP-c Primary Care & Sports Medicine [address]  [place], [place] [address] [phone]  New patient visit   Patient: Alyssa Stokes   DOB: April 13, 1991   30 y.o. Female  MRN: [id number] Visit Date: 06/27/2021  Patient Care Team: Margarita Bobrowski, Coralee Pesa, NP as PCP - General (Nurse Practitioner)  Today's healthcare provider: Orma Render, NP   Chief Complaint  Patient presents with   Establish Care    Patient states she want to establish care.  Patient states she has abdominal pain since 05/23/21.  She had her IUD removed 03/27/21 and no cycle since.   Subjective    Alyssa Stokes is a 30 y.o. female who presents today as a new patient to establish care.  HPI HPI     Establish Care    Additional comments: Patient states she want to establish care.  Patient states she has abdominal pain since 05/23/21.  She had her IUD removed 03/27/21 and no cycle since.      Last edited by Virginia Crews D, CMA on 06/27/2021 10:43 AM.      ABDOMINAL PAIN  Duration:months Onset: gradual Severity:  from mild crampy to 10/10 at worst Quality: dull, aching, cramping, and pulling Location:  suprapubic". "lower abdominal quadrants  Episode duration:  Radiation: no Frequency: constant Alleviating factors: heating pad, sitting Aggravating factors: stretching- but unsure overall Status:  a little better with Gemteza Treatments attempted:  Gemteza, diet changes, heating pad, stopping orange juice Fever: no Nausea: yes Vomiting: yes Weight loss: no Decreased appetite: yes Diarrhea: no Constipation: no Blood in stool: no Rash: no Dysuria/urinary frequency:  "twisting" sensation in bladder at emptying Hematuria: no History of sexually transmitted disease: no No recent sexual activity  Past Medical History:  Diagnosis Date   Asthma    Renal disorder    Past Surgical History:  Procedure Laterality Date   BREAST  SURGERY     Family Status  Relation Name Status   Mother  Alive   Father  Alive   Family History  Problem Relation Age of Onset   Healthy Mother    Healthy Father    Social History   Socioeconomic History   Marital status: Single    Spouse name: Not on file   Number of children: Not on file   Years of education: Not on file   Highest education level: Not on file  Occupational History   Not on file  Tobacco Use   Smoking status: Never   Smokeless tobacco: Never  Vaping Use   Vaping Use: Never used  Substance and Sexual Activity   Alcohol use: Yes    Alcohol/week: 1.0 standard drink    Types: 1 Glasses of wine per week   Drug use: Never   Sexual activity: Not Currently  Other Topics Concern   Not on file  Social History Narrative   Not on file   Social Determinants of Health   Financial Resource Strain: Not on file  Food Insecurity: Not on file  Transportation Needs: Not on file  Physical Activity: Not on file  Stress: Not on file  Social Connections: Not on file   Outpatient Medications Prior to Visit  Medication Sig   Vibegron (GEMTESA) 75 MG TABS Take 75 mg by mouth daily.   [DISCONTINUED] albuterol (VENTOLIN HFA) 108 (90 Base) MCG/ACT inhaler Inhale 2 puffs into the lungs every 6 (six) hours as needed for wheezing or shortness of  breath.   [DISCONTINUED] albuterol (VENTOLIN HFA) 108 (90 Base) MCG/ACT inhaler Inhale into the lungs.   [DISCONTINUED] oxybutynin (DITROPAN) 5 MG tablet Take 1 tablet (5 mg total) by mouth daily as needed for bladder spasms.   No facility-administered medications prior to visit.   Allergies  Allergen Reactions   Ondansetron Hives   Avocado Nausea Only    Tongue tingles Tongue tingles   Banana Nausea Only    Tongue tingled   Benadryl [Diphenhydramine] Palpitations    IV Bendaryl    Immunization History  Administered Date(s) Administered   DTP 06/09/1990, 08/04/1990, 12/08/1990, 08/17/1991   DTaP 02/18/1995   Hepatitis  B 07/03/2001, 08/14/2001, 01/08/2002   HiB (PRP-OMP) 06/09/1990, 08/04/1990, 12/08/1990, 08/17/1991   Influenza,inj,Quad PF,6+ Mos 06/23/2015, 07/05/2016, 06/30/2019   Influenza-Unspecified 06/23/2010, 07/05/2016, 06/29/2020   MMR 08/17/1991, 02/18/1995, 05/26/2010   Meningococcal Conjugate 04/05/2008   OPV 06/09/1990, 08/04/1990, 08/17/1991   PFIZER(Purple Top)SARS-COV-2 Vaccination 10/15/2019, 11/09/2019   PPD Test 04/13/2013, 04/13/2013, 06/23/2015, 06/23/2015   Td 11/27/2004   Tdap 09/05/2010, 03/29/2020   Varicella 05/27/1997, 04/05/2008, 05/26/2010, 06/23/2010    Health Maintenance  Topic Date Due   HIV Screening  Never done   Hepatitis C Screening  Never done   PAP SMEAR-Modifier  Never done   COVID-19 Vaccine (3 - Booster for Pfizer series) 04/08/2020   INFLUENZA VACCINE  07/31/2021 (Originally 05/15/2021)   TETANUS/TDAP  03/29/2030   Pneumococcal Vaccine 19-51 Years old  Aged Out   HPV VACCINES  Aged Out    Patient Care Team: Keishla Oyer, Coralee Pesa, NP as PCP - General (Nurse Practitioner)  Review of Systems All review of systems negative except what is listed in the HPI   Objective    BP 123/78   Pulse 92   Ht 5' 2" (1.575 m)   Wt 147 lb 6.4 oz (66.9 kg)   LMP  (LMP Unknown)   SpO2 100%   BMI 26.96 kg/m  Physical Exam Vitals and nursing note reviewed. Exam conducted with a chaperone present.  Constitutional:      Appearance: Normal appearance.  Eyes:     Extraocular Movements: Extraocular movements intact.     Conjunctiva/sclera: Conjunctivae normal.     Pupils: Pupils are equal, round, and reactive to light.  Cardiovascular:     Rate and Rhythm: Normal rate and regular rhythm.     Pulses: Normal pulses.     Heart sounds: Normal heart sounds.  Pulmonary:     Effort: Pulmonary effort is normal.     Breath sounds: Normal breath sounds.  Abdominal:     General: Abdomen is flat. Bowel sounds are normal. There is no distension.     Palpations: Abdomen is soft.  There is no mass.     Tenderness: There is abdominal tenderness. There is no right CVA tenderness, left CVA tenderness, guarding or rebound.     Hernia: No hernia is present. There is no hernia in the left inguinal area or right inguinal area.  Genitourinary:    General: Normal vulva.     Exam position: Lithotomy position.     Labia:        Right: No tenderness.        Left: No tenderness.      Vagina: Normal.     Cervix: Cervical motion tenderness and friability present. No discharge.     Uterus: Tender.      Adnexa:        Right: No tenderness.  Left: Tenderness present.   Lymphadenopathy:     Lower Body: No right inguinal adenopathy. No left inguinal adenopathy.  Skin:    General: Skin is warm and dry.     Capillary Refill: Capillary refill takes less than 2 seconds.  Neurological:     General: No focal deficit present.     Mental Status: She is alert and oriented to person, place, and time.  Psychiatric:        Mood and Affect: Mood normal.        Behavior: Behavior normal.        Thought Content: Thought content normal.        Judgment: Judgment normal.     Depression Screen PHQ 2/9 Scores 06/27/2021  PHQ - 2 Score 0  PHQ- 9 Score 0   Results for orders placed or performed in visit on 06/27/21  POCT urinalysis dipstick  Result Value Ref Range   Color, UA yellow    Clarity, UA clear    Glucose, UA Negative Negative   Bilirubin, UA negative    Ketones, UA negative    Spec Grav, UA >=1.030 (A) 1.010 - 1.025   Blood, UA negative    pH, UA 5.5 5.0 - 8.0   Protein, UA Negative Negative   Urobilinogen, UA 0.2 0.2 or 1.0 E.U./dL   Nitrite, UA negative    Leukocytes, UA Negative Negative   Appearance     Odor      Assessment & Plan      Problem List Items Addressed This Visit     Lower abdominal pain - Primary    Suprapubic pain present for 6-8 weeks Abnormal pap with colopo performed about 6 weeks ago with normal findings. IUD removed at that time. No  menses since that time.  CMT present with friability to cervix. Tenderness present to the right adnexa. UA negative today. Will send sureswab for further evaluation with pelvic and transvaginal US      Relevant Orders   POCT urinalysis dipstick (Completed)   Cervicovaginal ancillary only   US PELVIC COMPLETE WITH TRANSVAGINAL   Encounter for medical examination to establish care    Review of current and past medical history, social history, medication, and family history.  Review of care gaps and health maintenance recommendations.  Records from recent providers to be requested if not available in Chart Review or Care Everywhere.  Recommendations for health maintenance, diet, and exercise provided.           Return if symptoms worsen or fail to improve.      Sara E. Early, NP  [facility] [phone] (phone) [phone] (fax)  [facility]  

## 2021-06-27 NOTE — Patient Instructions (Addendum)
Recommendations from today's visit: I will let you know what the lab results show and we will discuss how we will proceed.  I sent the order for the ultrasound and they will call you to schedule this.   Information on diet, exercise, and health maintenance recommendations are listed below. This is information to help you be sure you are on track for optimal health and monitoring.   Please look over this and let us know if you have any questions or if you have completed any of the health maintenance outside of [facility] so that we can be sure your records are up to date.  ___________________________________________________________  Thank you for choosing [facility] for your Primary Care needs. I am excited for the opportunity to partner with you to meet your health care goals. It was a pleasure meeting you today!  I am an Adult-Geriatric Nurse Practitioner with a background in caring for patients for more than 20 years. I provide primary care and sports medicine services to patients age 63 and older within this office. I am also the director of the APP Fellowship with [facility].   I am passionate about providing the best service to you through preventive medicine and supportive care. I consider you a part of the medical team and value your input. I work diligently to ensure that you are heard and your needs are met in a safe and effective manner. I want you to feel comfortable with me as your provider and want you to know that your health concerns are important to me.  For your information, our office hours are Monday- Friday 8:00 AM - 5:00 PM At this time I am not in the office on Wednesdays.  If you have questions or concerns, please call our office at [phone] or send [place] a MyChart message and we will respond as quickly as possible.   For all urgent or time sensitive needs we ask that you please call the office to avoid delays. MyChart is not  constantly monitored and replies may take up to 72 business hours.  MyChart Policy: MyChart allows for you to see your visit notes, after visit summary, provider recommendations, lab and tests results, make an appointment, request refills, and contact your provider or the office for non-urgent questions or concerns. Providers are seeing patients during normal business hours and do not have built in time to review MyChart messages.  We ask that you allow a minimum of 4 business days for responses to [employer]. For this reason, please do not send urgent requests through [facility]. Please call the office at [phone]. Complex MyChart concerns may require a visit. Your provider may request you schedule a virtual or in person visit to ensure we are providing the best care possible. MyChart messages sent after 4:00 PM on Friday will not be received by the provider until Monday morning.    Lab and Test Results: You will receive your lab and test results on MyChart as soon as they are completed and results have been sent by the lab or testing facility. Due to this service, you will receive your results BEFORE your provider.  I review lab and tests results each morning prior to seeing patients. Some results require collaboration with other providers to ensure you are receiving the most appropriate care. For this reason, we ask that you please allow a minimum of 4 business days for your provider to receive and review lab and test results and  contact you about these.  Most lab and test result comments from the provider will be sent through [facility]. Your provider may recommend changes to the plan of care, follow-up visits, repeat testing, ask questions, or request an office visit to discuss these results. You may reply directly to this message or call the office at [phone] to provide information for the provider or set up an appointment. In some instances, you will be called with test results and  recommendations. Please let us know if this is preferred and we will make note of this in your chart to provide this for you.    If you have not heard a response to your lab or test results in 72 business hours, please call the office to let us know.   After Hours: For all non-emergency after hours needs, please call the office at [phone] and select the option to reach the on-call provider service. On-call services are shared between multiple [facility] offices and therefore it will not be possible to speak directly with your provider. On-call providers may provide medical advice and recommendations, but are unable to provide refills for maintenance medications.  For all emergency or urgent medical needs after normal business hours, we recommend that you seek care at the closest Urgent Care or Emergency Department to ensure appropriate treatment in a timely manner.  [facility] has a 24 hour emergency room located on the ground floor for your convenience.    Please do not hesitate to reach out to [place] with concerns.   Thank you, again, for choosing me as your health care partner. I appreciate your trust and look forward to learning more about you.   Worthy Keeler, DNP, AGNP-c ___________________________________________________________  Health Maintenance Recommendations Screening Testing Mammogram Every 1 -2 years based on history and risk factors Starting at age 36 Pap Smear Ages 21-39 every 3 years Ages 48-65 every 5 years with HPV testing More frequent testing may be required based on results and history Colon Cancer Screening Every 1-10 years based on test performed, risk factors, and history Starting at age 76 Bone Density Screening Every 2-10 years based on history Starting at age 73 for women Recommendations for men differ based on medication usage, history, and risk factors AAA Screening One time ultrasound Men 58-49 years old who have every  smoked Lung Cancer Screening Low Dose Lung CT every 12 months Age 69-80 years with a 30 pack-year smoking history who still smoke or who have quit within the last 15 years  Screening Labs Routine  Labs: Complete Blood Count (CBC), Complete Metabolic Panel (CMP), Cholesterol (Lipid Panel) Every 6-12 months based on history and medications May be recommended more frequently based on current conditions or previous results Hemoglobin A1c Lab Every 3-12 months based on history and previous results Starting at age 61 or earlier with diagnosis of diabetes, high cholesterol, BMI >26, and/or risk factors Frequent monitoring for patients with diabetes to ensure blood sugar control Thyroid Panel (TSH w/ T3 & T4) Every 6 months based on history, symptoms, and risk factors May be repeated more often if on medication HIV One time testing for all patients 38 and older May be repeated more frequently for patients with increased risk factors or exposure Hepatitis C One time testing for all patients 2 and older May be repeated more frequently for patients with increased risk factors or exposure Gonorrhea, Chlamydia Every 12 months for all sexually active persons 13-24 years Additional monitoring may be recommended for  those who are considered high risk or who have symptoms PSA Men 100-42 years old with risk factors Additional screening may be recommended from age 65-69 based on risk factors, symptoms, and history  Vaccine Recommendations Tetanus Booster All adults every 10 years Flu Vaccine All patients 6 months and older every year COVID Vaccine All patients 12 years and older Initial dosing with booster May recommend additional booster based on age and health history HPV Vaccine 2 doses all patients age 72-26 Dosing may be considered for patients over 26 Shingles Vaccine (Shingrix) 2 doses all adults 54 years and older Pneumonia (Pneumovax 23) All adults 34 years and older May recommend  earlier dosing based on health history Pneumonia (Prevnar 16) All adults 60 years and older Dosed 1 year after Pneumovax 23  Additional Screening, Testing, and Vaccinations may be recommended on an individualized basis based on family history, health history, risk factors, and/or exposure.  __________________________________________________________  Diet Recommendations for All Patients  I recommend that all patients maintain a diet low in saturated fats, carbohydrates, and cholesterol. While this can be challenging at first, it is not impossible and small changes can make big differences.  Things to try: Decreasing the amount of soda, sweet tea, and/or juice to one or less per day and replace with water While water is always the first choice, if you do not like water you may consider adding a water additive without sugar to improve the taste other sugar free drinks Replace potatoes with a brightly colored vegetable at dinner Use healthy oils, such as canola oil or olive oil, instead of butter or hard margarine Limit your bread intake to two pieces or less a day Replace regular pasta with low carb pasta options Bake, broil, or grill foods instead of frying Monitor portion sizes  Eat smaller, more frequent meals throughout the day instead of large meals  An important thing to remember is, if you love foods that are not great for your health, you don't have to give them up completely. Instead, allow these foods to be a reward when you have done well. Allowing yourself to still have special treats every once in a while is a nice way to tell yourself thank you for working hard to keep yourself healthy.   Also remember that every day is a new day. If you have a bad day and "fall off the wagon", you can still climb right back up and keep moving along on your journey!  We have resources available to help you!  Some websites that may be helpful  include: www.http://carter.biz/  Www.VeryWellFit.com _____________________________________________________________  Activity Recommendations for All Patients  I recommend that all adults get at least 20 minutes of moderate physical activity that elevates your heart rate at least 5 days out of the week.  Some examples include: Walking or jogging at a pace that allows you to carry on a conversation Cycling (stationary bike or outdoors) Water aerobics Yoga Weight lifting Dancing If physical limitations prevent you from putting stress on your joints, exercise in a pool or seated in a chair are excellent options.  Do determine your MAXIMUM heart rate for activity: YOUR AGE - 220 = MAX HeartRate   Remember! Do not push yourself too hard.  Start slowly and build up your pace, speed, weight, time in exercise, etc.  Allow your body to rest between exercise and get good sleep. You will need more water than normal when you are exerting yourself. Do not wait until you are  thirsty to drink. Drink with a purpose of getting in at least 8, 8 ounce glasses of water a day plus more depending on how much you exercise and sweat.    If you begin to develop dizziness, chest pain, abdominal pain, jaw pain, shortness of breath, headache, vision changes, lightheadedness, or other concerning symptoms, stop the activity and allow your body to rest. If your symptoms are severe, seek emergency evaluation immediately. If your symptoms are concerning, but not severe, please let us know so that we can recommend further evaluation.   ________________________________________________________________

## 2021-06-28 ENCOUNTER — Ambulatory Visit
Admission: RE | Admit: 2021-06-28 | Discharge: 2021-06-28 | Disposition: A | Discharge status: Home / Self Care | Payer: No Typology Code available for payment source | Source: Ambulatory Visit | Attending: Nurse Practitioner | Admitting: Nurse Practitioner

## 2021-06-28 DIAGNOSIS — R1030 Lower abdominal pain, unspecified: Secondary | ICD-10-CM

## 2021-06-28 LAB — CERVICOVAGINAL ANCILLARY ONLY
Bacterial Vaginitis (gardnerella): NEGATIVE
Candida Glabrata: NEGATIVE
Candida Vaginitis: NEGATIVE
Chlamydia: NEGATIVE
Comment: NEGATIVE
Comment: NEGATIVE
Comment: NEGATIVE
Comment: NEGATIVE
Comment: NEGATIVE
Comment: NORMAL
Neisseria Gonorrhea: NEGATIVE
Trichomonas: NEGATIVE

## 2021-07-03 ENCOUNTER — Encounter: Payer: No Typology Code available for payment source | Admitting: Family Medicine

## 2021-07-07 ENCOUNTER — Telehealth (HOSPITAL_BASED_OUTPATIENT_CLINIC_OR_DEPARTMENT_OTHER): Payer: Self-pay

## 2021-07-07 NOTE — Telephone Encounter (Signed)
-----   Message from [name], NP sent at 06/29/2021  7:06 AM EDT ----- [name],   Your vaginal swab did not show any signs of sexually transmitted infection, yeast, or bacteria present. Let's see what the ultrasound shows to determine what next steps we need to take. I will keep watching for this.   Alyssa Stokes

## 2021-07-07 NOTE — Telephone Encounter (Signed)
Results reviewed by patient via MyChart.  Seen on 06/29/2021  3:44 PM

## 2021-07-10 ENCOUNTER — Telehealth (HOSPITAL_BASED_OUTPATIENT_CLINIC_OR_DEPARTMENT_OTHER): Payer: Self-pay

## 2021-07-10 NOTE — Telephone Encounter (Signed)
Results reviewed by patient via MyChart.  Seen on 06/30/2021  8:29 AM Instructed patient to contact the office with any questions or concerns.

## 2021-07-10 NOTE — Telephone Encounter (Signed)
-----   Message from [name], NP sent at 06/30/2021  8:15 AM EDT ----- [name],   I have your ultrasound results and they are overall normal, which is reassuring. I am going to discuss with Dr. Hyacinth Meeker, the gynecologist in our office and see if she has any thoughts or recommendations and I will get back to you.   How are you feeling? Has anything changed?  Alyssa Stokes

## 2021-07-14 ENCOUNTER — Encounter: Payer: Self-pay | Admitting: Urology

## 2021-07-14 ENCOUNTER — Other Ambulatory Visit: Payer: Self-pay

## 2021-07-14 ENCOUNTER — Ambulatory Visit (INDEPENDENT_AMBULATORY_CARE_PROVIDER_SITE_OTHER): Payer: No Typology Code available for payment source | Admitting: Urology

## 2021-07-14 VITALS — BP 146/92 | HR 86 | Ht 62.0 in | Wt 147.0 lb

## 2021-07-14 DIAGNOSIS — R3989 Other symptoms and signs involving the genitourinary system: Secondary | ICD-10-CM

## 2021-07-14 MED ORDER — GEMTESA 75 MG PO TABS: 75.0000 mg | ORAL_TABLET | Freq: Every day | ORAL | 0 refills | Status: DC

## 2021-07-14 NOTE — Progress Notes (Signed)
   07/14/2021 5:26 PM   Alyssa Stokes March 15, 1991 [id number]  Referring provider: [name], NP [address] [place],  [place] [address]  Chief Complaint  Patient presents with   Follow-up    HPI: 30 y.o. female presents for follow-up visit.  Initially seen 06/21/2021 after pelvic/lower abdominal pain, dysuria, urgency and bladder spasms She was unable to identify any dietary triggers for her symptoms CT with nonobstructing 4 mm right lower pole calculus Urinalysis at initial visit unremarkable Given a trial of Gemtesa which she has tolerated and felt symptom improvement however on 07/06/2021 she had recurrent abdominal bloating, pelvic pain/cramping, dysuria and bladder spasm which lasted until 9/25 She is presently asymptomatic   PMH: Past Medical History:  Diagnosis Date   Asthma    Renal disorder     Surgical History: Past Surgical History:  Procedure Laterality Date   BREAST SURGERY      Home Medications:  Allergies as of 07/14/2021       Reactions   Ondansetron Hives   Avocado Nausea Only   Tongue tingles Tongue tingles   Banana Nausea Only   Tongue tingled   Benadryl [diphenhydramine] Palpitations   IV Bendaryl        Medication List        Accurate as of July 14, 2021  5:26 PM. If you have any questions, ask your nurse or doctor.          albuterol 108 (90 Base) MCG/ACT inhaler Commonly known as: VENTOLIN HFA Inhale 2 puffs into the lungs every 6 (six) hours as needed for wheezing or shortness of breath.   Gemtesa 75 MG Tabs Generic drug: Vibegron Take 75 mg by mouth daily.        Allergies:  Allergies  Allergen Reactions   Ondansetron Hives   Avocado Nausea Only    Tongue tingles Tongue tingles   Banana Nausea Only    Tongue tingled   Benadryl [Diphenhydramine] Palpitations    IV Bendaryl    Family History: Family History  Problem Relation Age of Onset   Healthy Mother    Healthy  Father     Social History:  reports that she has never smoked. She has never used smokeless tobacco. She reports current alcohol use of about 1.0 standard drink per week. She reports that she does not use drugs.   Physical Exam: BP (!) 146/92 (BP Location: Left Arm, Patient Position: Sitting, Cuff Size: Normal)   Pulse 86   Ht 5\' 2"  (1.575 m)   Wt 147 lb (66.7 kg)   LMP  (LMP Unknown)   BMI 26.89 kg/m   Constitutional:  Alert and oriented, No acute distress. HEENT:  AT, moist mucus membranes.  Trachea midline, no masses. Cardiovascular: No clubbing, cyanosis, or edema. Respiratory: Normal respiratory effort, no increased work of breathing.   Laboratory Data:  Urinalysis 07/14/2021: Dipstick/microscopy negative   Assessment & Plan:    1.  Pelvic pain/painful bladder Discussed adding amitriptyline or an antihistamine however she would like to hold off taking additional medications She would like to complete her 28-day course of Gemtesa then will discontinue the medication Discussed cystoscopy with hydrodistention Follow-up 2 months; earlier for recurrent symptoms   07/16/2021, MD  [facility] [address] [place], [place] [phone]

## 2021-07-16 ENCOUNTER — Encounter: Payer: Self-pay | Admitting: Urology

## 2021-07-18 LAB — URINALYSIS, COMPLETE
Bilirubin, UA: NEGATIVE
Glucose, UA: NEGATIVE
Ketones, UA: NEGATIVE
Leukocytes,UA: NEGATIVE
Nitrite, UA: NEGATIVE
Protein,UA: NEGATIVE
RBC, UA: NEGATIVE
Specific Gravity, UA: 1.015 (ref 1.005–1.030)
Urobilinogen, Ur: 0.2 mg/dL (ref 0.2–1.0)
pH, UA: 5.5 (ref 5.0–7.5)

## 2021-07-18 LAB — MICROSCOPIC EXAMINATION: RBC, Urine: NONE SEEN /hpf (ref 0–2)

## 2021-07-27 ENCOUNTER — Encounter: Payer: No Typology Code available for payment source | Admitting: Obstetrics & Gynecology

## 2021-10-02 ENCOUNTER — Other Ambulatory Visit (HOSPITAL_COMMUNITY): Payer: Self-pay

## 2021-10-13 ENCOUNTER — Ambulatory Visit: Payer: No Typology Code available for payment source | Admitting: Urology

## 2021-10-13 ENCOUNTER — Encounter: Payer: Self-pay | Admitting: Urology

## 2021-11-14 ENCOUNTER — Emergency Department (HOSPITAL_BASED_OUTPATIENT_CLINIC_OR_DEPARTMENT_OTHER)
Admission: EM | Admit: 2021-11-14 | Discharge: 2021-11-14 | Disposition: A | Discharge status: Home / Self Care | Payer: No Typology Code available for payment source | Attending: Emergency Medicine | Admitting: Emergency Medicine

## 2021-11-14 ENCOUNTER — Encounter (HOSPITAL_BASED_OUTPATIENT_CLINIC_OR_DEPARTMENT_OTHER): Payer: Self-pay | Admitting: Urology

## 2021-11-14 ENCOUNTER — Other Ambulatory Visit: Payer: Self-pay

## 2021-11-14 ENCOUNTER — Emergency Department (HOSPITAL_BASED_OUTPATIENT_CLINIC_OR_DEPARTMENT_OTHER): Payer: No Typology Code available for payment source

## 2021-11-14 DIAGNOSIS — R1012 Left upper quadrant pain: Secondary | ICD-10-CM | POA: Diagnosis not present

## 2021-11-14 DIAGNOSIS — M62830 Muscle spasm of back: Secondary | ICD-10-CM | POA: Insufficient documentation

## 2021-11-14 DIAGNOSIS — R109 Unspecified abdominal pain: Secondary | ICD-10-CM

## 2021-11-14 LAB — CBC WITH DIFFERENTIAL/PLATELET
Abs Immature Granulocytes: 0.02 10*3/uL (ref 0.00–0.07)
Basophils Absolute: 0 10*3/uL (ref 0.0–0.1)
Basophils Relative: 1 %
Eosinophils Absolute: 0.3 10*3/uL (ref 0.0–0.5)
Eosinophils Relative: 4 %
HCT: 38.9 % (ref 36.0–46.0)
Hemoglobin: 12.9 g/dL (ref 12.0–15.0)
Immature Granulocytes: 0 %
Lymphocytes Relative: 34 %
Lymphs Abs: 2.4 10*3/uL (ref 0.7–4.0)
MCH: 29 pg (ref 26.0–34.0)
MCHC: 33.2 g/dL (ref 30.0–36.0)
MCV: 87.4 fL (ref 80.0–100.0)
Monocytes Absolute: 0.6 10*3/uL (ref 0.1–1.0)
Monocytes Relative: 8 %
Neutro Abs: 3.8 10*3/uL (ref 1.7–7.7)
Neutrophils Relative %: 53 %
Platelets: 323 10*3/uL (ref 150–400)
RBC: 4.45 MIL/uL (ref 3.87–5.11)
RDW: 13.7 % (ref 11.5–15.5)
WBC: 7.1 10*3/uL (ref 4.0–10.5)
nRBC: 0 % (ref 0.0–0.2)

## 2021-11-14 LAB — HEPATIC FUNCTION PANEL
ALT: 12 U/L (ref 0–44)
AST: 16 U/L (ref 15–41)
Albumin: 4.5 g/dL (ref 3.5–5.0)
Alkaline Phosphatase: 38 U/L (ref 38–126)
Bilirubin, Direct: 0.1 mg/dL (ref 0.0–0.2)
Indirect Bilirubin: 0.2 mg/dL — ABNORMAL LOW (ref 0.3–0.9)
Total Bilirubin: 0.3 mg/dL (ref 0.3–1.2)
Total Protein: 7.4 g/dL (ref 6.5–8.1)

## 2021-11-14 LAB — URINALYSIS, ROUTINE W REFLEX MICROSCOPIC
Bilirubin Urine: NEGATIVE
Glucose, UA: NEGATIVE mg/dL
Hgb urine dipstick: NEGATIVE
Ketones, ur: NEGATIVE mg/dL
Leukocytes,Ua: NEGATIVE
Nitrite: NEGATIVE
Specific Gravity, Urine: 1.024 (ref 1.005–1.030)
pH: 5 (ref 5.0–8.0)

## 2021-11-14 LAB — BASIC METABOLIC PANEL
Anion gap: 10 (ref 5–15)
BUN: 6 mg/dL (ref 6–20)
CO2: 23 mmol/L (ref 22–32)
Calcium: 9.7 mg/dL (ref 8.9–10.3)
Chloride: 105 mmol/L (ref 98–111)
Creatinine, Ser: 0.74 mg/dL (ref 0.44–1.00)
GFR, Estimated: >60 mL/min (ref >60)
Glucose, Bld: 121 mg/dL — ABNORMAL HIGH (ref 70–99)
Potassium: 4 mmol/L (ref 3.5–5.1)
Sodium: 138 mmol/L (ref 135–145)

## 2021-11-14 LAB — PREGNANCY, URINE: Preg Test, Ur: NEGATIVE

## 2021-11-14 LAB — LIPASE, BLOOD: Lipase: 23 U/L (ref 11–51)

## 2021-11-14 MED ORDER — CYCLOBENZAPRINE HCL 10 MG PO TABS: 10.0000 mg | ORAL_TABLET | Freq: Two times a day (BID) | ORAL | 0 refills | Status: DC | PRN

## 2021-11-14 MED ORDER — SODIUM CHLORIDE 0.9 % IV BOLUS
1000.0000 mL | Freq: Once | INTRAVENOUS | Status: AC
  Administered 2021-11-14: 1000 mL via INTRAVENOUS

## 2021-11-14 MED ORDER — LIDOCAINE 5 % EX PTCH: 1.0000 | MEDICATED_PATCH | CUTANEOUS | 0 refills | Status: DC

## 2021-11-14 NOTE — ED Triage Notes (Signed)
Left lower back pain radiating to left pelvis x 3 weeks  H/o kidney stones  Denies any blood in urine

## 2021-11-14 NOTE — Discharge Instructions (Signed)
Your history, exam and work-up today let us to do CT imaging to rule out acute obstructing kidney stone and the images were reassuring.  Your urine did not show evidence of infection.  Culture was sent.  I suspect based on exam and history this is more of a muscle spasm and pain so please use the Lidoderm patches and muscle relaxant to help with symptoms.  Please rest and stay hydrated.  Please follow-up with your primary doctor.  If any symptoms change or worsen acutely, please return to the nearest emergency department.

## 2021-11-14 NOTE — ED Provider Notes (Signed)
[place] EMERGENCY DEPT Provider Note   CSN: [id number] Arrival date & time: 11/14/21  1215     History  Chief Complaint  Patient presents with   Flank Pain    Alyssa Stokes is a 31 y.o. female.  The history is provided by the patient and medical records. No language interpreter was used.  Flank Pain This is a recurrent problem. The current episode started more than 2 days ago (3 days wprse). The problem occurs constantly. The problem has not changed since onset.Associated symptoms include abdominal pain. Pertinent negatives include no chest pain, no headaches and no shortness of breath. The symptoms are aggravated by twisting and bending. Nothing relieves the symptoms. She has tried a warm compress and acetaminophen for the symptoms. The treatment provided no relief.      Home Medications Prior to Admission medications   Medication Sig Start Date End Date Taking? Authorizing Provider  albuterol (VENTOLIN HFA) 108 (90 Base) MCG/ACT inhaler Inhale 2 puffs into the lungs every 6 (six) hours as needed for wheezing or shortness of breath. 06/27/21   Orma Render, NP  Vibegron (GEMTESA) 75 MG TABS Take 75 mg by mouth daily. 07/14/21   Stoioff, Ronda Fairly, MD      Allergies    Ondansetron, Avocado, Banana, and Benadryl [diphenhydramine]    Review of Systems   Review of Systems  Constitutional:  Negative for chills, diaphoresis, fatigue and fever.  HENT:  Negative for congestion.   Eyes:  Negative for visual disturbance.  Respiratory:  Negative for cough, chest tightness and shortness of breath.   Cardiovascular:  Negative for chest pain and palpitations.  Gastrointestinal:  Positive for abdominal pain and nausea. Negative for abdominal distention, constipation, diarrhea and vomiting.  Genitourinary:  Positive for flank pain. Negative for decreased urine volume (darkened), dysuria, frequency, hematuria, pelvic pain, urgency, vaginal bleeding, vaginal  discharge and vaginal pain.  Musculoskeletal:  Positive for back pain. Negative for neck pain and neck stiffness.  Neurological:  Negative for light-headedness and headaches.  Psychiatric/Behavioral:  Negative for agitation and confusion.   All other systems reviewed and are negative.  Physical Exam Updated Vital Signs BP (!) 145/86    Pulse 94    Temp 98.4 F (36.9 C)    Resp 16    Ht 5\' 2"  (1.575 m)    Wt 68 kg    LMP 11/04/2021 (Exact Date)    SpO2 98%    BMI 27.44 kg/m  Physical Exam Vitals and nursing note reviewed.  Constitutional:      General: She is not in acute distress.    Appearance: She is well-developed. She is not ill-appearing, toxic-appearing or diaphoretic.  HENT:     Head: Normocephalic and atraumatic.  Eyes:     Conjunctiva/sclera: Conjunctivae normal.     Pupils: Pupils are equal, round, and reactive to light.  Cardiovascular:     Rate and Rhythm: Normal rate and regular rhythm.     Heart sounds: No murmur heard. Pulmonary:     Effort: Pulmonary effort is normal. No respiratory distress.     Breath sounds: Normal breath sounds. No wheezing, rhonchi or rales.  Chest:     Chest wall: No tenderness.  Abdominal:     General: Abdomen is flat. There is no distension.     Palpations: Abdomen is soft.     Tenderness: There is abdominal tenderness in the left upper quadrant. There is left CVA tenderness. There is no right CVA tenderness,  guarding or rebound.    Musculoskeletal:        General: Tenderness present. No swelling.     Cervical back: Neck supple. No tenderness.     Right lower leg: No edema.     Left lower leg: No edema.  Skin:    General: Skin is warm and dry.     Capillary Refill: Capillary refill takes less than 2 seconds.     Findings: No erythema.  Neurological:     General: No focal deficit present.     Mental Status: She is alert.     Motor: No weakness.  Psychiatric:        Mood and Affect: Mood normal.    ED Results / Procedures /  Treatments   Labs (all labs ordered are listed, but only abnormal results are displayed) Labs Reviewed  URINALYSIS, ROUTINE W REFLEX MICROSCOPIC - Abnormal; Notable for the following components:      Result Value   Protein, ur TRACE (*)    All other components within normal limits  BASIC METABOLIC PANEL - Abnormal; Notable for the following components:   Glucose, Bld 121 (*)    All other components within normal limits  HEPATIC FUNCTION PANEL - Abnormal; Notable for the following components:   Indirect Bilirubin 0.2 (*)    All other components within normal limits  URINE CULTURE  PREGNANCY, URINE  CBC WITH DIFFERENTIAL/PLATELET  LIPASE, BLOOD    EKG None  Radiology CT Renal Stone Study  Result Date: 11/14/2021 CLINICAL DATA:  Flank pain. EXAM: CT ABDOMEN AND PELVIS WITHOUT CONTRAST TECHNIQUE: Multidetector CT imaging of the abdomen and pelvis was performed following the standard protocol without IV contrast. RADIATION DOSE REDUCTION: This exam was performed according to the departmental dose-optimization program which includes automated exposure control, adjustment of the mA and/or kV according to patient size and/or use of iterative reconstruction technique. COMPARISON:  CT abdomen pelvis dated June 16, 2021. FINDINGS: Lower chest: No acute abnormality. Hepatobiliary: No focal liver abnormality. Layering sludge within the gallbladder. No gallstones, gallbladder wall thickening or biliary dilatation. Pancreas: Unremarkable. No pancreatic ductal dilatation or surrounding inflammatory changes. Spleen: Normal in size without focal abnormality. Adrenals/Urinary Tract: Adrenal glands are unremarkable. Unchanged small right renal calculus. No ureteral calculi or hydronephrosis. The bladder is unremarkable. Stomach/Bowel: Stomach is within normal limits. Tiny appendicolith at the tip of the appendix. Appendix otherwise appears normal. No evidence of bowel wall thickening, distention, or  inflammatory changes. Vascular/Lymphatic: No significant vascular findings are present. No enlarged abdominal or pelvic lymph nodes. Reproductive: Uterus and bilateral adnexa are unremarkable. Other: Small amount of free fluid in the pelvis, likely physiologic. No pneumoperitoneum. Musculoskeletal: No acute or significant osseous findings. IMPRESSION: 1. No acute intra-abdominal process. 2. Unchanged nonobstructive right nephrolithiasis. Electronically Signed   By: Titus Dubin M.D.   On: 11/14/2021 17:20    Procedures Procedures    Medications Ordered in ED Medications  sodium chloride 0.9 % bolus 1,000 mL (0 mLs Intravenous Stopped 11/14/21 1850)    ED Course/ Medical Decision Making/ A&P                           Medical Decision Making Amount and/or Complexity of Data Reviewed Independent Historian: parent External Data Reviewed: radiology and notes. Labs: ordered. Radiology: ordered.   Niamh Kameika Smigelski is a 31 y.o. female with a past medical history significant for previous kidney stones, asthma, and chronic palpitations who presents with  left flank and back pain acutely worsening for the last 3 days.  She reports that for the last 3 weeks she has had some mild soreness which she felt started after she was working for several days in a row.  She reports she thought it was a muscle spasm or muscle ache and used heat and Tylenol but did not seem to help.  She reports last 3 days it has worsened and she is concerned it could be kidney stone.  She reports it wraps around towards her left abdomen but does not go all the way down to the pelvis.  No pelvic pain.  No vaginal symptoms.  She denies any dysuria or hematuria but does report a slightly darkened.  She denies constipation or diarrhea and no bloody stools.  No other trauma reported.  No anterior abdominal pain otherwise.  She reports some nausea but no vomiting.  No fevers, chills congestion, cough.  No other complaints  reported.  Reports the pain gets up to 10 out of 10 at times but is now moderate.  She drove herself here and does not want pain medicine initially.  On exam, lungs clear and chest nontender.  Anterior abdomen is nontender.  Left flank is tender and left side of the abdomen is tender to palpation laterally.  She does have some left CVA tenderness.  Some muscle spasm did appear to be seen posteriorly but no midline tenderness in the back.  Normal bowel sounds.  Exam otherwise unremarkable.  Clinically I do suspect she could have either muscle spasm versus a UTI with [place] versus stone recurrence.  We will get screening labs and a stone study to look for obstructing stone.  Patient reports she drove so does not want pain medicine but we will give her some fluids for the reportedly decreased and darkened urine and she reports some decreased intake recently.  Anticipate reassessment after work-up to determine disposition.  If work-up is reassuring, suspect is more flank wall and back wall discomfort and will treat symptomatically with some muscle medications likely.  7:07 PM CT scan does not show evidence of obstructing stone or other acute abnormality.  Patient likely has a muscle spasm and musculoskeletal pains.  We discussed a plan and patient is in agreement with Lidoderm patches and muscle relaxant and PCP follow-up.  She understands return precautions and follow-up instructions.  She no other questions or concerns and was discharged in good condition.        Final Clinical Impression(s) / ED Diagnoses Final diagnoses:  Flank pain, acute  Muscle spasm of back    Rx / DC Orders ED Discharge Orders          Ordered    lidocaine (LIDODERM) 5 %  Every 24 hours        11/14/21 1908    cyclobenzaprine (FLEXERIL) 10 MG tablet  2 times daily PRN        11/14/21 1908            Clinical Impression: 1. Flank pain, acute   2. Muscle spasm of back     Disposition: Admit  This note  was prepared with assistance of Dragon voice recognition software. Occasional wrong-word or sound-a-like substitutions may have occurred due to the inherent limitations of voice recognition software.      Halo Shevlin, Gwenyth Allegra, MD 11/14/21 [phone]

## 2021-11-16 LAB — URINE CULTURE: Culture: 80000 — AB

## 2021-11-17 ENCOUNTER — Telehealth (HOSPITAL_BASED_OUTPATIENT_CLINIC_OR_DEPARTMENT_OTHER): Payer: Self-pay | Admitting: Emergency Medicine

## 2021-11-17 NOTE — Telephone Encounter (Signed)
Post ED Visit - Positive Culture Follow-up  Culture report reviewed by antimicrobial stewardship pharmacist: [facility] []  , Pharm.D. []  Enzo Bi, Pharm.D., BCPS AQ-ID []  , Pharm.D., BCPS []  Celedonio Miyamoto, Pharm.D., BCPS []  [place], Garvin Fila.D., BCPS, AAHIVP []  , Pharm.D., BCPS, AAHIVP []  Georgina Pillion, PharmD, BCPS []  , PharmD, BCPS []  [place], PharmD, BCPS [x]  [address], PharmD []  , PharmD, BCPS []  Estella Husk, PharmD  Pharmacy Team []  Lysle Pearl, PharmD []  , PharmD []  Phillips Climes, PharmD []  , Rph []  Agapito Games) , PharmD []  Daylene Posey, PharmD []  , PharmD []  Mervyn Gay, PharmD []  , PharmD []  Vinnie Level, PharmD []  [facility], PharmD []  , PharmD []  Len Childs, PharmD   Positive urine culture No further patient follow-up is required at this time. , PA  Greer Pickerel C Cassey Bacigalupo 11/17/2021, 9:27 AM

## 2021-11-22 ENCOUNTER — Ambulatory Visit (HOSPITAL_BASED_OUTPATIENT_CLINIC_OR_DEPARTMENT_OTHER): Payer: No Typology Code available for payment source | Admitting: Nurse Practitioner

## 2021-11-28 ENCOUNTER — Encounter (HOSPITAL_BASED_OUTPATIENT_CLINIC_OR_DEPARTMENT_OTHER): Payer: Self-pay | Admitting: Nurse Practitioner

## 2021-11-28 ENCOUNTER — Ambulatory Visit (INDEPENDENT_AMBULATORY_CARE_PROVIDER_SITE_OTHER): Payer: No Typology Code available for payment source | Admitting: Nurse Practitioner

## 2021-11-28 ENCOUNTER — Other Ambulatory Visit (HOSPITAL_COMMUNITY): Payer: Self-pay

## 2021-11-28 ENCOUNTER — Other Ambulatory Visit: Payer: Self-pay

## 2021-11-28 ENCOUNTER — Other Ambulatory Visit (HOSPITAL_BASED_OUTPATIENT_CLINIC_OR_DEPARTMENT_OTHER): Payer: Self-pay | Admitting: Nurse Practitioner

## 2021-11-28 VITALS — BP 117/72 | HR 108 | Temp 99.5°F | Resp 12 | Ht 62.0 in | Wt 145.0 lb

## 2021-11-28 DIAGNOSIS — J029 Acute pharyngitis, unspecified: Secondary | ICD-10-CM | POA: Diagnosis not present

## 2021-11-28 DIAGNOSIS — R002 Palpitations: Secondary | ICD-10-CM

## 2021-11-28 DIAGNOSIS — M5442 Lumbago with sciatica, left side: Secondary | ICD-10-CM

## 2021-11-28 MED ORDER — PREDNISONE 20 MG PO TABS
40.0000 mg | ORAL_TABLET | Freq: Every day | ORAL | 0 refills | Status: DC
  Filled 2021-11-28: qty 10, 5d supply, fill #0

## 2021-11-28 MED ORDER — LIDOCAINE VISCOUS HCL 2 % MT SOLN
15.0000 mL | OROMUCOSAL | 3 refills | Status: DC | PRN
  Filled 2021-11-28: qty 100, 1d supply, fill #0

## 2021-11-28 NOTE — Patient Instructions (Addendum)
I have sent in an order for the x-ray to be done at [facility] inside of [facility]. You can walk in to have this done.   I have also sent a referral for PT for your back. If the x-ray shows a reason you should not start this, I will let you know.   I have also send in a steroid burst to help with inflammation to see if this helps with the pain.   I will let you know what your swabs show. I sent in the lidocaine for you to swish and spit to help with throat pain.   I will let you know if the labs show anything that could be causing the palpitations. This could be anxiety related from everything going on right now, but we want to make sure that there is nothing else going on.   I recommend you speak with HR to get FMLA paperwork started. They can send this to me to complete.

## 2021-11-29 ENCOUNTER — Other Ambulatory Visit (HOSPITAL_BASED_OUTPATIENT_CLINIC_OR_DEPARTMENT_OTHER): Payer: Self-pay | Admitting: Nurse Practitioner

## 2021-11-29 ENCOUNTER — Encounter (HOSPITAL_BASED_OUTPATIENT_CLINIC_OR_DEPARTMENT_OTHER): Payer: Self-pay

## 2021-11-29 ENCOUNTER — Encounter (HOSPITAL_BASED_OUTPATIENT_CLINIC_OR_DEPARTMENT_OTHER): Payer: Self-pay | Admitting: Nurse Practitioner

## 2021-11-29 ENCOUNTER — Other Ambulatory Visit (HOSPITAL_COMMUNITY): Payer: Self-pay

## 2021-11-29 DIAGNOSIS — R6889 Other general symptoms and signs: Secondary | ICD-10-CM

## 2021-11-29 LAB — CBC WITH DIFFERENTIAL/PLATELET
Basophils Absolute: 0 10*3/uL (ref 0.0–0.2)
Basos: 1 %
EOS (ABSOLUTE): 0 10*3/uL (ref 0.0–0.4)
Eos: 1 %
Hematocrit: 40.7 % (ref 34.0–46.6)
Hemoglobin: 13.4 g/dL (ref 11.1–15.9)
Immature Grans (Abs): 0 10*3/uL (ref 0.0–0.1)
Immature Granulocytes: 0 %
Lymphocytes Absolute: 0.6 10*3/uL — ABNORMAL LOW (ref 0.7–3.1)
Lymphs: 10 %
MCH: 28.1 pg (ref 26.6–33.0)
MCHC: 32.9 g/dL (ref 31.5–35.7)
MCV: 85 fL (ref 79–97)
Monocytes Absolute: 0.8 10*3/uL (ref 0.1–0.9)
Monocytes: 13 %
Neutrophils Absolute: 4.7 10*3/uL (ref 1.4–7.0)
Neutrophils: 75 %
Platelets: 257 10*3/uL (ref 150–450)
RBC: 4.77 x10E6/uL (ref 3.77–5.28)
RDW: 13.2 % (ref 11.7–15.4)
WBC: 6.2 10*3/uL (ref 3.4–10.8)

## 2021-11-29 LAB — COVID-19, FLU A+B AND RSV
Influenza A, NAA: NOT DETECTED
Influenza B, NAA: NOT DETECTED
RSV, NAA: NOT DETECTED
SARS-CoV-2, NAA: DETECTED — AB

## 2021-11-29 LAB — TSH: TSH: 0.451 u[IU]/mL (ref 0.450–4.500)

## 2021-11-29 LAB — IRON,TIBC AND FERRITIN PANEL
Ferritin: 265 ng/mL — ABNORMAL HIGH (ref 15–150)
Iron Saturation: 7 % — CL (ref 15–55)
Iron: 21 ug/dL — ABNORMAL LOW (ref 27–159)
Total Iron Binding Capacity: 304 ug/dL (ref 250–450)
UIBC: 283 ug/dL (ref 131–425)

## 2021-11-29 LAB — T4: T4, Total: 6.6 ug/dL (ref 4.5–12.0)

## 2021-11-29 LAB — T3: T3, Total: 108 ng/dL (ref 71–180)

## 2021-11-29 MED ORDER — HYDROCODONE BIT-HOMATROP MBR 5-1.5 MG/5ML PO SOLN: 5.0000 mL | Freq: Three times a day (TID) | ORAL | 0 refills | Status: DC | PRN

## 2021-11-30 ENCOUNTER — Encounter (HOSPITAL_BASED_OUTPATIENT_CLINIC_OR_DEPARTMENT_OTHER): Payer: Self-pay | Admitting: Nurse Practitioner

## 2021-11-30 ENCOUNTER — Encounter (HOSPITAL_BASED_OUTPATIENT_CLINIC_OR_DEPARTMENT_OTHER): Payer: Self-pay

## 2021-11-30 DIAGNOSIS — M5442 Lumbago with sciatica, left side: Secondary | ICD-10-CM

## 2021-11-30 DIAGNOSIS — J029 Acute pharyngitis, unspecified: Secondary | ICD-10-CM | POA: Insufficient documentation

## 2021-11-30 HISTORY — DX: Lumbago with sciatica, left side: M54.42

## 2021-11-30 NOTE — Telephone Encounter (Signed)
This encounter was created in error - please disregard.

## 2021-11-30 NOTE — Assessment & Plan Note (Signed)
EKG in office today grossly normal.  No alarm symptoms present at this time. Heart rate is elevated at this time however patient is mildly febrile. No arrhythmias noted on auscultation or EKG. Discussed option of monitoring labs for further evaluation.  Patient may benefit from [facility] patch.  Will determine next steps based on lab results.

## 2021-11-30 NOTE — Assessment & Plan Note (Signed)
Erythema present to the posterior oropharynx with no visible exudate present.  Symptoms and presentation likely of viral origin.  Will swab today for flu, COVID, RSV and monitor.  She does have a low-grade fever at 99.5 in the office today but no other significant symptoms. Recommend lidocaine swish, warm drinks, increase hydration, and rest.  We will make changes to plan of care based on lab results.

## 2021-11-30 NOTE — Progress Notes (Signed)
Established Patient Office Visit  Subjective:  Patient ID: Alyssa Stokes, female    DOB: January 17, 1991  Age: 31 y.o. MRN: [id number]  CC:  Chief Complaint  Patient presents with   Follow-up    HPI Alyssa Stokes presents for follow-up for low back pain, palpitations, and sore throat. Low back pain Patient was seen on 11/14/2021 for flank pain that has been present for approximately 2 to 3 days at that time.  She reports she initially thought her symptoms were related to possible kidney stone and CT was performed.  She reports that the pain has not changed and is traveling down the left thigh.  She reports increase pain with bending and lifting.  She did go back to work Friday and she is unsure if the increased movement exacerbated this or the problem is simply ongoing.  She would like to have an x-ray of the back if possible.  Palpitations She reports waking up at times during the night with sensation of her heart beating fast.  She denies shortness of breath, dizziness, chest pain, paresthesias, headache.  She reports this has been happening for the past several weeks and it is concerning to her.  She is unclear of the etiology.  She reports that she does not particularly feel any more anxious than usual.  Sore throat Patient endorses onset of sore throat starting yesterday.  She has not taken anything for this.  She has not recent sick contacts.  She is having some mild congestion but no cough, fever, chills, body aches present at this time.    Outpatient Medications Prior to Visit  Medication Sig Dispense Refill   albuterol (VENTOLIN HFA) 108 (90 Base) MCG/ACT inhaler Inhale 2 puffs into the lungs every 6 (six) hours as needed for wheezing or shortness of breath. 8 g 3   cyclobenzaprine (FLEXERIL) 10 MG tablet Take 1 tablet (10 mg total) by mouth 2 (two) times daily as needed for muscle spasms. 20 tablet 0   lidocaine (LIDODERM) 5 % Place 1 patch onto the  skin daily. Remove & Discard patch within 12 hours or as directed by MD 15 patch 0   Vibegron (GEMTESA) 75 MG TABS Take 75 mg by mouth daily. 14 tablet 0   No facility-administered medications prior to visit.    Allergies  Allergen Reactions   Ondansetron Hives   Avocado Nausea Only    Tongue tingles Tongue tingles   Banana Nausea Only    Tongue tingled   Benadryl [Diphenhydramine] Palpitations    IV Bendaryl    ROS Review of Systems Review of systems negative except for what is listed in HPI    Objective:    Physical Exam Vitals and nursing note reviewed.  Constitutional:      Appearance: Normal appearance.  HENT:     Head: Normocephalic.     Right Ear: Tympanic membrane normal.     Left Ear: Tympanic membrane normal.     Nose: Congestion present.     Mouth/Throat:     Mouth: Mucous membranes are moist.     Pharynx: Posterior oropharyngeal erythema present.  Eyes:     Extraocular Movements: Extraocular movements intact.     Conjunctiva/sclera: Conjunctivae normal.     Pupils: Pupils are equal, round, and reactive to light.  Cardiovascular:     Rate and Rhythm: Regular rhythm. Tachycardia present.     Pulses: Normal pulses.     Heart sounds: Normal heart sounds. No murmur heard. Pulmonary:  Effort: Pulmonary effort is normal.     Breath sounds: Normal breath sounds. No wheezing or rhonchi.  Abdominal:     General: Bowel sounds are normal. There is no distension.     Palpations: Abdomen is soft.     Tenderness: There is no abdominal tenderness. There is no right CVA tenderness, left CVA tenderness, guarding or rebound.  Musculoskeletal:        General: Tenderness present.     Cervical back: Normal range of motion.     Thoracic back: No spasms or tenderness. Normal range of motion.     Lumbar back: Tenderness present. No swelling, deformity or bony tenderness. Normal range of motion.       Back:     Left lower leg: No edema.     Comments: Area of  tenderness presenting from the left flank/lower back down through buttocks to the anterior lateral thigh.  No decreased range of motion  Lymphadenopathy:     Cervical: Cervical adenopathy present.  Skin:    General: Skin is warm and dry.     Capillary Refill: Capillary refill takes less than 2 seconds.  Neurological:     General: No focal deficit present.     Mental Status: She is alert and oriented to person, place, and time.  Psychiatric:        Mood and Affect: Mood normal.        Behavior: Behavior normal.        Thought Content: Thought content normal.        Judgment: Judgment normal.    LMP 11/04/2021 (Exact Date)  Wt Readings from Last 3 Encounters:  11/14/21 150 lb (68 kg)  07/14/21 147 lb (66.7 kg)  06/27/21 147 lb 6.4 oz (66.9 kg)     Assessment & Plan:   Problem List Items Addressed This Visit   None Visit Diagnoses     Acute left-sided low back pain with left-sided sciatica    -  Primary   Relevant Medications   predniSONE (DELTASONE) 20 MG tablet   Other Relevant Orders   DG Lumbar Spine 2-3 Views   Ambulatory referral to Physical Therapy   Heart palpitations       Relevant Orders   TSH (Completed)   T4 (Completed)   T3 (Completed)   CBC with Differential/Platelet (Completed)   Iron, TIBC and Ferritin Panel (Completed)   Sore throat       Relevant Medications   lidocaine (XYLOCAINE) 2 % solution   Other Relevant Orders   COVID-19, Flu A+B and RSV       Meds ordered this encounter  Medications   predniSONE (DELTASONE) 20 MG tablet    Sig: Take 2 tablets by mouth daily with breakfast.    Dispense:  10 tablet    Refill:  0   lidocaine (XYLOCAINE) 2 % solution    Sig: Use as directed 15 mLs in the mouth or throat every 3 hours as needed (mouth/throat pain - gargle and spit).    Dispense:  100 mL    Refill:  3    Follow-up: Return if symptoms worsen or fail to improve.    [name], NP

## 2021-11-30 NOTE — Assessment & Plan Note (Signed)
Symptoms and presentation consistent with sciatic pain of the lumbar region into the left thigh. Discussed with patient the option of imaging versus conservative therapy.  At this time she would like to proceed with imaging as she is concerned that the pain is changing. We will begin treatment today with prednisone burst and referral to PT. patient provided with at home stretching exercises to be completed. Review of imaging from recent emergency room visit shows the presence of a stable nephrolithiasis, nonobstructing that has been seen on previous imaging.  No indications of abdominal irregularity that could be contributing to her symptoms. We will make changes to plan of care based on findings from imaging.

## 2021-12-09 ENCOUNTER — Encounter (HOSPITAL_BASED_OUTPATIENT_CLINIC_OR_DEPARTMENT_OTHER): Payer: Self-pay | Admitting: Nurse Practitioner

## 2021-12-15 ENCOUNTER — Other Ambulatory Visit: Payer: Self-pay

## 2021-12-15 ENCOUNTER — Encounter (HOSPITAL_BASED_OUTPATIENT_CLINIC_OR_DEPARTMENT_OTHER): Payer: Self-pay | Admitting: Nurse Practitioner

## 2021-12-15 ENCOUNTER — Other Ambulatory Visit (HOSPITAL_COMMUNITY): Payer: Self-pay

## 2021-12-15 ENCOUNTER — Ambulatory Visit (INDEPENDENT_AMBULATORY_CARE_PROVIDER_SITE_OTHER): Payer: No Typology Code available for payment source | Admitting: Nurse Practitioner

## 2021-12-15 VITALS — BP 122/72 | HR 104 | Ht 62.0 in | Wt 148.1 lb

## 2021-12-15 DIAGNOSIS — U099 Post covid-19 condition, unspecified: Secondary | ICD-10-CM

## 2021-12-15 DIAGNOSIS — J189 Pneumonia, unspecified organism: Secondary | ICD-10-CM

## 2021-12-15 DIAGNOSIS — R002 Palpitations: Secondary | ICD-10-CM

## 2021-12-15 DIAGNOSIS — M5442 Lumbago with sciatica, left side: Secondary | ICD-10-CM

## 2021-12-15 DIAGNOSIS — R0602 Shortness of breath: Secondary | ICD-10-CM | POA: Diagnosis not present

## 2021-12-15 MED ORDER — AMOXICILLIN-POT CLAVULANATE 875-125 MG PO TABS
1.0000 | ORAL_TABLET | Freq: Two times a day (BID) | ORAL | 0 refills | Status: DC
  Filled 2021-12-15: qty 10, 5d supply, fill #0

## 2021-12-15 MED ORDER — HYDROCODONE BIT-HOMATROP MBR 5-1.5 MG/5ML PO SOLN
5.0000 mL | Freq: Three times a day (TID) | ORAL | 0 refills | Status: DC | PRN
  Filled 2021-12-15: qty 120, 8d supply, fill #0

## 2021-12-15 MED ORDER — GUAIFENESIN ER 600 MG PO TB12
1200.0000 mg | ORAL_TABLET | Freq: Two times a day (BID) | ORAL | 1 refills | Status: DC
Start: 2021-12-15 — End: 2022-02-01
  Filled 2021-12-15: qty 30, 8d supply, fill #0

## 2021-12-15 MED ORDER — PREDNISONE 20 MG PO TABS
40.0000 mg | ORAL_TABLET | Freq: Every day | ORAL | 0 refills | Status: DC
  Filled 2021-12-15: qty 10, 5d supply, fill #0

## 2021-12-15 MED ORDER — AZITHROMYCIN 250 MG PO TABS
ORAL_TABLET | ORAL | 0 refills | Status: AC
  Filled 2021-12-15: qty 6, 5d supply, fill #0

## 2021-12-15 MED ORDER — BENZONATATE 200 MG PO CAPS
200.0000 mg | ORAL_CAPSULE | Freq: Three times a day (TID) | ORAL | 0 refills | Status: DC | PRN
  Filled 2021-12-15: qty 30, 10d supply, fill #0

## 2021-12-15 NOTE — Patient Instructions (Signed)
I suspect you have bacterial pneumonia and a sinus infection after COVID.  ? ?I am going to start you on Augmentin and Azithromycin ? ?I want you take 1000mg  of Tylenol and alternate with 800mg  Ibuprofen every 4 hours for the next 48 hours.  ? ?Be sure you are drinking plenty of fluids- being hydrated is very important.  ? ?I sent in Mucinex, more cough syrup, and tessalon perles to the pharmacy.  ? ?I want you to practice taking in deep breaths and blowing out like you are blowing through a straw several times a day.  ? ?I want you to rest over the weekend and not do anything strenuous. It would be best for you to take off on Monday and Tuesday, but I understand if you are concerned about occurrences.  ? ? ?

## 2021-12-15 NOTE — Progress Notes (Signed)
? ?Acute Office Visit ? ?Subjective:  ? ? Patient ID: Alyssa Stokes, female    DOB: 02-09-1991, 31 y.o.   MRN: [id number] ? ?Chief Complaint  ?Patient presents with  ? Follow-up  ?  Patient presents today she is still coughing and shortness of breath. She was COVID + 11/28/21. She went back to work and they gave her a heavy load. She feels like she needs tome out to get better.   ? ? ?HPI ?Patient is in today for continuing symptoms after having COVID on 11/28/2021. She endorses ongoing fatigue, shortness of breath and cough. She endorses a sensation of her heart racing when at rest. She initially went back to work, but reports that she was barely able to make it through her shift due to severe symptoms. She is not sure if she has run a fever but she endorses chills are present.  ?She has been trying to rest as much as possible, but with her work schedule she must work for 12 hours at a time which is very difficult. She endorses concerns with her job as they do not accept a work excuse and she was told by her manager that any time missed will result in points on her record against her. She tells me due to this she has been trying to push through, but she is feeling so poorly she feels she needs additional time off.  ? ? ?Outpatient Medications Prior to Visit  ?Medication Sig Dispense Refill  ? albuterol (VENTOLIN HFA) 108 (90 Base) MCG/ACT inhaler Inhale 2 puffs into the lungs every 6 (six) hours as needed for wheezing or shortness of breath. 8 g 3  ? cyclobenzaprine (FLEXERIL) 10 MG tablet Take 1 tablet (10 mg total) by mouth 2 (two) times daily as needed for muscle spasms. 20 tablet 0  ? lidocaine (LIDODERM) 5 % Place 1 patch onto the skin daily. Remove & Discard patch within 12 hours or as directed by MD 15 patch 0  ? lidocaine (XYLOCAINE) 2 % solution Use as directed 15 mLs in the mouth or throat every 3 hours as needed (mouth/throat pain - gargle and spit). 100 mL 3  ? Vibegron (GEMTESA) 75 MG TABS  Take 75 mg by mouth daily. 14 tablet 0  ? HYDROcodone bit-homatropine (HYCODAN) 5-1.5 MG/5ML syrup Take 5 mLs by mouth every 8 (eight) hours as needed for cough (congestion.). Will make you sleepy. Do not drive while taking. 120 mL 0  ? predniSONE (DELTASONE) 20 MG tablet Take 2 tablets by mouth daily with breakfast. 10 tablet 0  ? ?No facility-administered medications prior to visit.  ? ? ?Allergies  ?Allergen Reactions  ? Ondansetron Hives  ? Avocado Nausea Only  ?  Tongue tingles ?Tongue tingles  ? Banana Nausea Only  ?  Tongue tingled  ? Benadryl [Diphenhydramine] Palpitations  ?  IV Bendaryl  ? ? ?Review of Systems ?All review of systems negative except what is listed in the HPI ? ?   ?Objective:  ?  ?Physical Exam ?Vitals and nursing note reviewed.  ?Constitutional:   ?   Appearance: She is ill-appearing.  ?HENT:  ?   Head: Normocephalic.  ?   Right Ear: Tympanic membrane normal.  ?   Left Ear: Tympanic membrane normal.  ?   Nose: Congestion and rhinorrhea present.  ?   Mouth/Throat:  ?   Pharynx: Posterior oropharyngeal erythema present.  ?Eyes:  ?   Extraocular Movements: Extraocular movements intact.  ?  Conjunctiva/sclera: Conjunctivae normal.  ?   Pupils: Pupils are equal, round, and reactive to light.  ?Neck:  ?   Vascular: No carotid bruit.  ?Cardiovascular:  ?   Rate and Rhythm: Regular rhythm. Tachycardia present.  ?   Pulses: Normal pulses.  ?   Heart sounds: Normal heart sounds.  ?Pulmonary:  ?   Breath sounds: Wheezing and rhonchi present.  ?Abdominal:  ?   General: Bowel sounds are normal.  ?   Palpations: Abdomen is soft.  ?Musculoskeletal:  ?   Cervical back: Normal range of motion.  ?   Right lower leg: No edema.  ?   Left lower leg: No edema.  ?Lymphadenopathy:  ?   Cervical: Cervical adenopathy present.  ?Skin: ?   General: Skin is warm and dry.  ?   Capillary Refill: Capillary refill takes less than 2 seconds.  ?Neurological:  ?   General: No focal deficit present.  ?   Mental Status: She  is alert and oriented to person, place, and time.  ?   Motor: Weakness present.  ?Psychiatric:     ?   Mood and Affect: Mood normal.     ?   Behavior: Behavior normal.     ?   Thought Content: Thought content normal.     ?   Judgment: Judgment normal.  ? ? ?BP 122/72   Pulse (!) 104   Ht 5\' 2"  (1.575 m)   Wt 148 lb 1.6 oz (67.2 kg)   SpO2 96%   BMI 27.09 kg/m?  ?Wt Readings from Last 3 Encounters:  ?12/15/21 148 lb 1.6 oz (67.2 kg)  ?11/28/21 145 lb (65.8 kg)  ?11/14/21 150 lb (68 kg)  ?   ?Assessment & Plan:  ? ?Problem List Items Addressed This Visit   ? ? Post-COVID chronic palpitations - Primary  ?  Patient febrile again today with HR in low 100's. Regular rhythm present.  ?I do suspect that this is related to recent COVID infection and current fever. Will work to get temperature down and manage conservatively.  ?If symptoms persist longer than 1 week, recommend cardiology referral for evaluation.  ?  ?  ? Relevant Medications  ? amoxicillin-clavulanate (AUGMENTIN) 875-125 MG tablet  ? azithromycin (ZITHROMAX) 250 MG tablet  ? HYDROcodone bit-homatropine (HYCODAN) 5-1.5 MG/5ML syrup  ? benzonatate (TESSALON) 200 MG capsule  ? guaiFENesin (MUCINEX) 600 MG 12 hr tablet  ? predniSONE (DELTASONE) 20 MG tablet  ? Other Relevant Orders  ? D-Dimer, Quantitative (Completed)  ? Community acquired pneumonia  ?  Patient is febrile and ill appearing today with bilateral rhonchi and wheezes, shortness of breath on exertion, and tachycardia. Cough is frequent and productive. Presentation consistent with CAP post COVID infection.  ?Recommend antibiotic treatment, steroid burst, rest, and hydration. Discussed with patient that I do not recommend she return to work for at least 4 days and only return at that time if she has been afebrile for 24 hours with medication and her shortness of breath has improved.  ?Recommend contacting [facility] to inform them of pneumonia to see if she can get an exception to the absence policy.   ?Patient instructed on strict return/emergency evaluation instructions.  ?Recommend f/u if sx worsen or fail to improve with treatment.  ?  ?  ? Relevant Medications  ? amoxicillin-clavulanate (AUGMENTIN) 875-125 MG tablet  ? azithromycin (ZITHROMAX) 250 MG tablet  ? HYDROcodone bit-homatropine (HYCODAN) 5-1.5 MG/5ML syrup  ? benzonatate (TESSALON) 200 MG capsule  ?  guaiFENesin (MUCINEX) 600 MG 12 hr tablet  ? predniSONE (DELTASONE) 20 MG tablet  ? Shortness of breath  ?  Post covid symptoms with suspected bilateral pna. Will send antibiotics and steroid burst today. Continue with inhaler. Tylenol and Ibuprofen for fever. Rest. Increase hydration.  ?Sleep and rest on abdomen as much as possible to help facilitate increased oxygen exchange.  ?If no improvement in 1 week, notify office and we will send referral to pulmonology for further testing.  ?  ?  ? Relevant Orders  ? D-Dimer, Quantitative (Completed)  ? ? ? ?Meds ordered this encounter  ?Medications  ? amoxicillin-clavulanate (AUGMENTIN) 875-125 MG tablet  ?  Sig: Take 1 tablet by mouth 2 (two) times daily.  ?  Dispense:  10 tablet  ?  Refill:  0  ? azithromycin (ZITHROMAX) 250 MG tablet  ?  Sig: Take 2 tablets on day 1, then 1 tablet daily on days 2 through 5  ?  Dispense:  6 tablet  ?  Refill:  0  ? HYDROcodone bit-homatropine (HYCODAN) 5-1.5 MG/5ML syrup  ?  Sig: Take 5 mLs by mouth every 8 (eight) hours as needed for cough.  ?  Dispense:  120 mL  ?  Refill:  0  ? benzonatate (TESSALON) 200 MG capsule  ?  Sig: Take 1 capsule (200 mg total) by mouth 3 (three) times daily as needed for cough.  ?  Dispense:  30 capsule  ?  Refill:  0  ? guaiFENesin (MUCINEX) 600 MG 12 hr tablet  ?  Sig: Take 2 tablets (1,200 mg total) by mouth 2 (two) times daily.  ?  Dispense:  30 tablet  ?  Refill:  1  ? predniSONE (DELTASONE) 20 MG tablet  ?  Sig: Take 2 tablets by mouth daily with breakfast.  ?  Dispense:  10 tablet  ?  Refill:  0  ? ? ? ?[name], NP ? ?

## 2021-12-16 LAB — D-DIMER, QUANTITATIVE: D-DIMER: 0.29 mg/L FEU (ref 0.00–0.49)

## 2021-12-18 ENCOUNTER — Encounter (HOSPITAL_BASED_OUTPATIENT_CLINIC_OR_DEPARTMENT_OTHER): Payer: Self-pay | Admitting: Nurse Practitioner

## 2021-12-18 DIAGNOSIS — J3089 Other allergic rhinitis: Secondary | ICD-10-CM | POA: Insufficient documentation

## 2021-12-18 DIAGNOSIS — J189 Pneumonia, unspecified organism: Secondary | ICD-10-CM | POA: Insufficient documentation

## 2021-12-18 DIAGNOSIS — R0602 Shortness of breath: Secondary | ICD-10-CM | POA: Insufficient documentation

## 2021-12-18 NOTE — Assessment & Plan Note (Signed)
Patient febrile again today with HR in low 100's. Regular rhythm present.  ?I do suspect that this is related to recent COVID infection and current fever. Will work to get temperature down and manage conservatively.  ?If symptoms persist longer than 1 week, recommend cardiology referral for evaluation.  ?

## 2021-12-18 NOTE — Assessment & Plan Note (Addendum)
Patient is febrile and ill appearing today with bilateral rhonchi and wheezes, shortness of breath on exertion, and tachycardia. Cough is frequent and productive. Presentation consistent with CAP post COVID infection.  ?Recommend antibiotic treatment, steroid burst, rest, and hydration. Discussed with patient that I do not recommend she return to work for at least 4 days and only return at that time if she has been afebrile for 24 hours with medication and her shortness of breath has improved.  ?Recommend contacting [facility] to inform them of pneumonia to see if she can get an exception to the absence policy.  ?Patient instructed on strict return/emergency evaluation instructions.  ?Recommend f/u if sx worsen or fail to improve with treatment.  ?

## 2021-12-18 NOTE — Assessment & Plan Note (Signed)
Post covid symptoms with suspected bilateral pna. Will send antibiotics and steroid burst today. Continue with inhaler. Tylenol and Ibuprofen for fever. Rest. Increase hydration.  ?Sleep and rest on abdomen as much as possible to help facilitate increased oxygen exchange.  ?If no improvement in 1 week, notify office and we will send referral to pulmonology for further testing.  ?

## 2021-12-20 ENCOUNTER — Encounter (HOSPITAL_BASED_OUTPATIENT_CLINIC_OR_DEPARTMENT_OTHER): Payer: Self-pay | Admitting: Nurse Practitioner

## 2021-12-20 ENCOUNTER — Telehealth (HOSPITAL_BASED_OUTPATIENT_CLINIC_OR_DEPARTMENT_OTHER): Payer: Self-pay | Admitting: Nurse Practitioner

## 2021-12-20 ENCOUNTER — Other Ambulatory Visit (HOSPITAL_COMMUNITY): Payer: Self-pay

## 2021-12-20 NOTE — Telephone Encounter (Signed)
Received a fax from [employer] on 12/20/21 with FMLA forms. Please review and let me know if we need to proceed with the proper FMLA filing system we have for the office with charges that apply. Forms are in providers Red Dot tray. ?Please advise. ?

## 2021-12-22 ENCOUNTER — Other Ambulatory Visit (HOSPITAL_BASED_OUTPATIENT_CLINIC_OR_DEPARTMENT_OTHER): Payer: Self-pay | Admitting: Nurse Practitioner

## 2021-12-22 ENCOUNTER — Other Ambulatory Visit (HOSPITAL_COMMUNITY): Payer: Self-pay

## 2021-12-22 DIAGNOSIS — R002 Palpitations: Secondary | ICD-10-CM

## 2021-12-22 DIAGNOSIS — F5105 Insomnia due to other mental disorder: Secondary | ICD-10-CM

## 2021-12-22 DIAGNOSIS — F32A Depression, unspecified: Secondary | ICD-10-CM

## 2021-12-22 DIAGNOSIS — F419 Anxiety disorder, unspecified: Secondary | ICD-10-CM

## 2021-12-22 DIAGNOSIS — F418 Other specified anxiety disorders: Secondary | ICD-10-CM

## 2021-12-22 MED ORDER — SERTRALINE HCL 50 MG PO TABS
50.0000 mg | ORAL_TABLET | Freq: Every day | ORAL | 3 refills | Status: DC
  Filled 2021-12-22: qty 30, 30d supply, fill #0
  Filled 2022-01-19: qty 30, 30d supply, fill #1
  Filled 2022-02-21: qty 30, 30d supply, fill #2
  Filled 2022-03-21: qty 30, 30d supply, fill #3

## 2021-12-22 MED ORDER — HYDROXYZINE HCL 50 MG PO TABS
50.0000 mg | ORAL_TABLET | Freq: Every evening | ORAL | 3 refills | Status: DC | PRN
  Filled 2021-12-22: qty 60, 30d supply, fill #0
  Filled 2022-01-19: qty 60, 30d supply, fill #1

## 2022-01-03 ENCOUNTER — Telehealth (HOSPITAL_BASED_OUTPATIENT_CLINIC_OR_DEPARTMENT_OTHER): Payer: Self-pay

## 2022-01-03 NOTE — Telephone Encounter (Signed)
Completed FMLA paper work was faxed over to [employer] 23 pages [phone] ?

## 2022-01-09 ENCOUNTER — Telehealth (HOSPITAL_BASED_OUTPATIENT_CLINIC_OR_DEPARTMENT_OTHER): Payer: Self-pay

## 2022-01-09 NOTE — Telephone Encounter (Signed)
Forms were faxed from [employer] with error to be corrected. Spoke with Alyssa Stokes and they were corrected and complete and faxed back to [employer] @ [phone]. ?

## 2022-01-10 ENCOUNTER — Ambulatory Visit: Payer: Self-pay

## 2022-01-10 ENCOUNTER — Other Ambulatory Visit: Payer: Self-pay | Admitting: Nurse Practitioner

## 2022-01-10 DIAGNOSIS — M79644 Pain in right finger(s): Secondary | ICD-10-CM

## 2022-01-10 DIAGNOSIS — M25531 Pain in right wrist: Secondary | ICD-10-CM

## 2022-01-19 ENCOUNTER — Other Ambulatory Visit (HOSPITAL_BASED_OUTPATIENT_CLINIC_OR_DEPARTMENT_OTHER): Payer: Self-pay | Admitting: Nurse Practitioner

## 2022-01-19 ENCOUNTER — Other Ambulatory Visit (HOSPITAL_COMMUNITY): Payer: Self-pay

## 2022-01-21 ENCOUNTER — Other Ambulatory Visit (HOSPITAL_BASED_OUTPATIENT_CLINIC_OR_DEPARTMENT_OTHER): Payer: Self-pay

## 2022-01-21 MED ORDER — ALBUTEROL SULFATE HFA 108 (90 BASE) MCG/ACT IN AERS
2.0000 | INHALATION_SPRAY | Freq: Four times a day (QID) | RESPIRATORY_TRACT | 3 refills | Status: DC | PRN
  Filled 2022-01-21: qty 8.5, 25d supply, fill #0
  Filled 2022-05-12: qty 6.7, 25d supply, fill #1
  Filled 2022-06-16: qty 6.7, 25d supply, fill #2
  Filled 2022-07-12: qty 6.7, 25d supply, fill #3
  Filled 2022-09-16: qty 6.7, 25d supply, fill #4

## 2022-01-22 ENCOUNTER — Other Ambulatory Visit (HOSPITAL_COMMUNITY): Payer: Self-pay

## 2022-01-29 ENCOUNTER — Encounter (HOSPITAL_BASED_OUTPATIENT_CLINIC_OR_DEPARTMENT_OTHER): Payer: Self-pay | Admitting: Nurse Practitioner

## 2022-01-29 ENCOUNTER — Ambulatory Visit (INDEPENDENT_AMBULATORY_CARE_PROVIDER_SITE_OTHER): Payer: No Typology Code available for payment source | Admitting: Nurse Practitioner

## 2022-01-29 ENCOUNTER — Other Ambulatory Visit (HOSPITAL_COMMUNITY): Payer: Self-pay

## 2022-01-29 VITALS — BP 117/72 | HR 86 | Temp 98.7°F | Ht 63.0 in | Wt 156.1 lb

## 2022-01-29 DIAGNOSIS — R002 Palpitations: Secondary | ICD-10-CM | POA: Diagnosis not present

## 2022-01-29 DIAGNOSIS — B028 Zoster with other complications: Secondary | ICD-10-CM | POA: Diagnosis not present

## 2022-01-29 DIAGNOSIS — U099 Post covid-19 condition, unspecified: Secondary | ICD-10-CM

## 2022-01-29 DIAGNOSIS — J189 Pneumonia, unspecified organism: Secondary | ICD-10-CM | POA: Diagnosis not present

## 2022-01-29 DIAGNOSIS — J302 Other seasonal allergic rhinitis: Secondary | ICD-10-CM | POA: Diagnosis not present

## 2022-01-29 DIAGNOSIS — J3089 Other allergic rhinitis: Secondary | ICD-10-CM

## 2022-01-29 MED ORDER — LEVOCETIRIZINE DIHYDROCHLORIDE 5 MG PO TABS
5.0000 mg | ORAL_TABLET | Freq: Every evening | ORAL | 3 refills | Status: AC
  Filled 2022-01-29: qty 90, 90d supply, fill #0
  Filled 2022-03-07 – 2022-04-18 (×3): qty 90, 90d supply, fill #1
  Filled 2022-07-09: qty 90, 90d supply, fill #2

## 2022-01-29 MED ORDER — MONTELUKAST SODIUM 10 MG PO TABS
10.0000 mg | ORAL_TABLET | Freq: Every day | ORAL | 3 refills | Status: DC
  Filled 2022-01-29: qty 30, 30d supply, fill #0
  Filled 2022-02-21: qty 30, 30d supply, fill #1
  Filled 2022-04-18: qty 30, 30d supply, fill #2
  Filled 2022-05-12: qty 30, 30d supply, fill #3

## 2022-01-29 MED ORDER — BENZONATATE 200 MG PO CAPS
200.0000 mg | ORAL_CAPSULE | Freq: Three times a day (TID) | ORAL | 3 refills | Status: DC | PRN
  Filled 2022-01-29: qty 45, 15d supply, fill #0
  Filled 2022-03-07: qty 45, 15d supply, fill #1
  Filled 2022-04-18: qty 45, 15d supply, fill #2
  Filled 2022-05-12: qty 45, 15d supply, fill #3

## 2022-01-29 MED ORDER — VALACYCLOVIR HCL 1 G PO TABS
1000.0000 mg | ORAL_TABLET | Freq: Three times a day (TID) | ORAL | 0 refills | Status: DC
  Filled 2022-01-29: qty 21, 7d supply, fill #0

## 2022-01-29 NOTE — Patient Instructions (Addendum)
It was a pleasure seeing you today. I hope your time spent with [place] was pleasant and helpful. Please let us know if there is anything we can do to improve the service you receive.  ? ?Today we discussed concerns with: ?Cough, sneezing, runny noes, rash, and sore throat ? ?Seasonal allergies have been particularly bad this year with many patients not having good control with regular medications.  ?I have sent in xyzal for you to try and singulair for allergies.  ? ?I sent in valacyclovir 1000mg  three times a day for 7 days ? ?Immune Booster ?Vitamin D- 2000 units a day ?Zinc 10-20mg  a day ?Vitamin C 500mg  a day  ?Melatonin 5mg  at bedtime ? ? ? ?Important Office Information ?Lab Results ?If labs were ordered, please note that you will see results through MyChart as soon as they come available from [facility].  ?It takes up to 5 business days for the results to be routed to me and for me to review them once all of the lab results have come through from [facility]. I will make recommendations based on your results and send these through MyChart or someone from the office will call you to discuss. If your labs are abnormal, we may contact you to schedule a visit to discuss the results and make recommendations.  ?If you have not heard from within 5 business days or you have waited longer than a week and your lab results have not come through on MyChart, please feel free to call the office or send a message through MyChart to follow-up on these labs.  ? ?Referrals ?If referrals were placed today, the office where the referral was sent will contact you either by phone or through MyChart to set up scheduling. Please note that it can take up to a week for the referral office to contact you. If you do not hear from them in a week, please contact the referral office directly to inquire about scheduling.  ? ?Condition Treated ?If your condition worsens or you begin to have new symptoms, please schedule a follow-up appointment for  further evaluation. If you are not sure if an appointment is needed, you may call the office to leave a message for the nurse and someone will contact you with recommendations.  ?If you have an urgent or life threatening emergency, please do not call the office, but seek emergency evaluation by calling 911 or going to the nearest emergency room for evaluation.  ? ?MyChart and Phone Calls ?Please do not use MyChart for urgent messages. It may take up to 3 business days for MyChart messages to be read by staff and if they are unable to handle the request, an additional 3 business days for them to be routed to me and for my response.  ?Messages sent to the provider through MyChart do not come directly to the provider, please allow time for these messages to be routed and for me to respond.  ?We get a large volume of MyChart messages daily and these are responded to in the order received.  ? ?For urgent messages, please call the office at [phone] and speak with the front office staff or leave a message on the line of my assistant for guidance.  ?We are seeing patients from the hours of 8:00 am through 5:00 pm and calls directly to the nurse may not be answered immediately due to seeing patients, but your call will be returned as soon as possible.  ?Phone  messages received  after 4:00 PM Monday through Thursday may not be returned until the following business day. Phone messages received after 11:00 AM on Friday may not be returned until Monday.  ? ?After Hours ?We share on call hours with providers from other offices. If you have an urgent need after hours that cannot wait until the next business day, please contact the on call provider by calling the office number. A nurse will speak with you and contact the provider if needed for recommendations.  ?If you have an urgent or life threatening emergency after hours, please do not call the on call provider, but seek emergency evaluation by calling 911 or going to the  nearest emergency room for evaluation.  ? ?Paperwork ?All paperwork requires a minimum of 5 days to complete and return to you or the designated personnel. Please keep this in mind when bringing in forms or sending requests for paperwork completion to the office.  ?  ?

## 2022-01-29 NOTE — Progress Notes (Signed)
?[name], DNP, AGNP-c ?[facility] ?[address]  [address] ?[place], [place] [address] ?[phone] ? ?Subjective:  ? ?Alyssa Stokes is a 31 y.o. female presents to day for sneezing, cough, rhinorrhea, rash, and sore throat.  ?URI ?Symptoms started over a week ago ?She does have a history of seasonal allergies- thought this was allergy ?She is currently taking medication for allergies with no improvement ?Fever, chills, cough, sneezing, sore throat present ? ?Rash ?Noticed first over the weekend with mild swelling ?Only present on the eyelid with blisters and swelling ?Worsening this morning with increased rash area and tenderness ?No drainage or itching at this time ?Burning sensation present ?Pain radiates to the temple area and above the eye with no rash present at this time ? ?PMH, Medications, and Allergies reviewed and updated in chart.  ? ? ?Objective:  ?BP 117/72   Pulse 86   Temp 98.7 ?F (37.1 ?C)   Ht 5\' 3"  (1.6 m)   Wt 156 lb 1.6 oz (70.8 kg)   SpO2 97%   BMI 27.65 kg/m?  ?ROS negative except for what is listed in HPI. ? ?Physical Exam ?Vitals and nursing note reviewed.  ?Constitutional:   ?   Appearance: She is ill-appearing.  ?HENT:  ?   Head:  ?   Comments: Pustular rash present to the left eyelid with edema noted. Coalescence present.  No drainage at this time. Tenderness to mild palpation to the left temple and orbital region. Discoloration of skin is also present.  ?   Ears:  ?   Comments: Bilateral effusion present with erythema and bulging to the TM.  ?   Nose: Congestion and rhinorrhea present.  ?   Mouth/Throat:  ?   Mouth: Mucous membranes are moist.  ?   Pharynx: Posterior oropharyngeal erythema present.  ?Eyes:  ?   Extraocular Movements: Extraocular movements intact.  ?   Conjunctiva/sclera: Conjunctivae normal.  ?Cardiovascular:  ?   Rate and Rhythm: Regular rhythm.  ?   Pulses: Normal pulses.  ?   Heart sounds: Normal  heart sounds.  ?Pulmonary:  ?   Effort: Pulmonary effort is normal.  ?   Breath sounds: Wheezing and rhonchi present.  ?Musculoskeletal:     ?   General: Normal range of motion.  ?   Right lower leg: No edema.  ?   Left lower leg: No edema.  ?Lymphadenopathy:  ?   Cervical: Cervical adenopathy present.  ?Skin: ?   General: Skin is warm and dry.  ?   Capillary Refill: Capillary refill takes less than 2 seconds.  ?   Findings: Erythema and rash present.  ?Neurological:  ?   General: No focal deficit present.  ?   Mental Status: She is alert and oriented to person, place, and time.  ?Psychiatric:     ?   Mood and Affect: Mood normal.     ?   Behavior: Behavior normal.     ?   Thought Content: Thought content normal.     ?   Judgment: Judgment normal.  ? ? ?    ? ?Assessment & Plan:  ? ?Herpes zoster with complication ?Symptoms and presentation consistent with herpes zoster of the eyelid. Will begin treatment today with valacyclovir and send for urgent referral to ophthalmology due to association with eye. Discussed with patient the etiology and importance of ophthalmology follow-up due to increased risk of damage to vision, including blindness that  may result from eye infection. She expressed understanding.  ? ?Environmental and seasonal allergies ?Continued symptoms associated with recent CAP and allergy exacerbation. Her lungs do sound better at this time, but her congestion and effusion is worse. Suspect this is related to allergy symptoms. Will send treatment for allergies and monitor for new or worsening symptoms. She is afebrile today. Discussed importance of continuation of allergy medication at least through the next few months to help during peak allergy season. She expressed understanding. She will finish all medications and monitor for new or worsening symptoms.  ?Plan to follow-up if she becomes febrile or shortness of breath or new symptoms develop.  ? ? ?History and Medication reviewed and updated this  encounter.  ? ?[name], DNP, AGNP-c ?02/04/2022  9:47 PM   ? ?

## 2022-01-30 ENCOUNTER — Encounter (HOSPITAL_BASED_OUTPATIENT_CLINIC_OR_DEPARTMENT_OTHER): Payer: Self-pay | Admitting: Nurse Practitioner

## 2022-02-01 ENCOUNTER — Encounter (HOSPITAL_BASED_OUTPATIENT_CLINIC_OR_DEPARTMENT_OTHER): Payer: Self-pay | Admitting: Nurse Practitioner

## 2022-02-01 ENCOUNTER — Other Ambulatory Visit (HOSPITAL_BASED_OUTPATIENT_CLINIC_OR_DEPARTMENT_OTHER): Payer: Self-pay | Admitting: Nurse Practitioner

## 2022-02-01 DIAGNOSIS — N760 Acute vaginitis: Secondary | ICD-10-CM

## 2022-02-01 MED ORDER — FLUCONAZOLE 150 MG PO TABS: ORAL_TABLET | ORAL | 2 refills | Status: DC

## 2022-02-01 MED ORDER — METRONIDAZOLE 0.75 % EX GEL: 1.0000 "application " | Freq: Two times a day (BID) | CUTANEOUS | 3 refills | Status: DC

## 2022-02-02 ENCOUNTER — Ambulatory Visit (HOSPITAL_BASED_OUTPATIENT_CLINIC_OR_DEPARTMENT_OTHER): Payer: No Typology Code available for payment source | Admitting: Nurse Practitioner

## 2022-02-02 ENCOUNTER — Ambulatory Visit (INDEPENDENT_AMBULATORY_CARE_PROVIDER_SITE_OTHER): Payer: No Typology Code available for payment source | Admitting: Nurse Practitioner

## 2022-02-02 ENCOUNTER — Ambulatory Visit (INDEPENDENT_AMBULATORY_CARE_PROVIDER_SITE_OTHER): Payer: No Typology Code available for payment source | Admitting: Cardiology

## 2022-02-02 ENCOUNTER — Ambulatory Visit (INDEPENDENT_AMBULATORY_CARE_PROVIDER_SITE_OTHER): Payer: No Typology Code available for payment source

## 2022-02-02 ENCOUNTER — Encounter (HOSPITAL_BASED_OUTPATIENT_CLINIC_OR_DEPARTMENT_OTHER): Payer: Self-pay | Admitting: Cardiology

## 2022-02-02 VITALS — BP 126/76 | HR 90 | Ht 63.0 in | Wt 155.6 lb

## 2022-02-02 DIAGNOSIS — Z8616 Personal history of COVID-19: Secondary | ICD-10-CM

## 2022-02-02 DIAGNOSIS — Z7189 Other specified counseling: Secondary | ICD-10-CM | POA: Diagnosis not present

## 2022-02-02 DIAGNOSIS — R002 Palpitations: Secondary | ICD-10-CM

## 2022-02-02 NOTE — Progress Notes (Signed)
?Cardiology Office Note:   ? ?Date:  02/02/2022  ? ?ID:  Alyssa Stokes, DOB [birth year], MRN [id number] ? ?PCP:  [name], NP  ?Cardiologist:  Jodelle Red, MD ? ?Referring MD: [name], NP  ? ?CC: new patient consultation for post Covid palpitations ? ?History of Present Illness:   ? ?Alyssa Stokes is a 31 y.o. female with a hx of migraine who is seen as a new consult at the request of Early, Sung Amabile, NP for the evaluation and management of post Covid palpitations. ? ?Note from 12/15/21 with Enid Skeens, NP reviewed. Noted persistent Covid symptoms and palpitations. Referred to cardiology for further evaluation. ? ?Tachycardia/palpitations: ?-Initial onset: started prior to Covid, at least a month before. Noted at night. ?-Frequency/Duration: happens at night, comes and goes, can keep her up for an hour but the palpitations are brief. Was happening every night, now less frequent (last episode 4 days ago) ?-Associated symptoms: shortness of breath ?-Aggravating/alleviating factors: better if she lays on her stomach ?-Syncope/near syncope: none ?-Prior cardiac history: might have had a murmur as a child, hasn't been told that anyone has heard it recently. ?-Prior workup: none ?-Prior treatment: none ?-Possible medication interactions: can't take Zofran, but takes Phenergan as needed. Hasn't used in over a month. Wonders if phenergan could be related.  ?-Caffeine: hardly any ?-Alcohol: about once/month ?-Tobacco: none ?-OTC supplements: only melatonin sleep gummy and zyrtec ?-Comorbidities: none ?-Exercise level: walks without limitations ?-Labs: TSH, kidney function/electrolytes, CBC reviewed. ?-Cardiac ROS: no chest pain, no PND, no orthopnea, no LE edema. ?-Family history:  mother had palpitations (but was related to thyroid issues). No other history. ? ?Past Medical History:  ?Diagnosis Date  ? Acute left-sided low back pain with left-sided sciatica 11/30/2021  ? Asthma   ?  Elevated blood-pressure reading without diagnosis of hypertension 02/17/2021  ? Last Assessment & Plan:  Formatting of this note might be different from the original. Advised patient to do home blood pressure monitoring.  We need to rule out an arrhythmia causing temporary elevations in blood pressure so referral to cardiology made.  ? Encounter for medical examination to establish care 06/27/2021  ? Renal disorder   ? ? ?Past Surgical History:  ?Procedure Laterality Date  ? BREAST SURGERY    ? ? ?Current Medications: ?Current Outpatient Medications on File Prior to Visit  ?Medication Sig  ? albuterol (VENTOLIN HFA) 108 (90 Base) MCG/ACT inhaler Inhale 2 puffs into the lungs every 6 (six) hours as needed for wheezing or shortness of breath.  ? benzonatate (TESSALON) 200 MG capsule Take 1 capsule (200 mg total) by mouth 3 (three) times daily as needed for cough.  ? fluconazole (DIFLUCAN) 150 MG tablet Take one tablet by mouth at the first sign of symptoms of yeast. If no resolution, repeat dose in 72 hours.  ? hydrOXYzine (ATARAX) 50 MG tablet Take 1 tablet (50 mg total) by mouth at bedtime and may repeat dose one time if needed. For anxiety and sleep.  ? levocetirizine (XYZAL) 5 MG tablet Take 1 tablet (5 mg total) by mouth every evening.  ? metroNIDAZOLE (METROGEL) 0.75 % gel Apply 1 application. topically 2 (two) times daily.  ? montelukast (SINGULAIR) 10 MG tablet Take 1 tablet (10 mg total) by mouth at bedtime.  ? sertraline (ZOLOFT) 50 MG tablet Take 1 tablet (50 mg total) by mouth daily.  ? valACYclovir (VALTREX) 1000 MG tablet Take 1 tablet (1,000 mg total) by mouth 3 (three)  times daily.  ? ?No current facility-administered medications on file prior to visit.  ?  ? ?Allergies:   Ondansetron, Avocado, Banana, and Benadryl [diphenhydramine]  ? ?Social History  ? ?Tobacco Use  ? Smoking status: Never  ? Smokeless tobacco: Never  ?Vaping Use  ? Vaping Use: Never used  ?Substance Use Topics  ? Alcohol use: Yes  ?   Alcohol/week: 1.0 standard drink  ?  Types: 1 Glasses of wine per week  ? Drug use: Never  ? ? ?Family History: ?family history includes Healthy in her father and mother. ? ?ROS:   ?Please see the history of present illness.  Additional pertinent ROS: ?Constitutional: Negative for chills, fever, night sweats, unintentional weight loss  ?HENT: Negative for ear pain and hearing loss.   ?Eyes: Negative for loss of vision and eye pain.  ?Respiratory: Negative for cough, sputum, wheezing.   ?Cardiovascular: See HPI. ?Gastrointestinal: Negative for abdominal pain, melena, and hematochezia.  ?Genitourinary: Negative for dysuria and hematuria.  ?Musculoskeletal: Negative for falls and myalgias.  ?Skin: Negative for itching and rash.  ?Neurological: Negative for focal weakness, focal sensory changes and loss of consciousness.  ?Endo/Heme/Allergies: Does not bruise/bleed easily.   ? ? ?EKGs/Labs/Other Studies Reviewed:   ? ?The following studies were reviewed today: ?No prior ? ?EKG:  EKG is personally reviewed.   ?02/02/22: NSR at 91 bpm ? ?Recent Labs: ?11/14/2021: ALT 12; BUN 6; Creatinine, Ser 0.74; Potassium 4.0; Sodium 138 ?11/28/2021: Hemoglobin 13.4; Platelets 257; TSH 0.451  ?Recent Lipid Panel ?No results found for: CHOL, TRIG, HDL, CHOLHDL, VLDL, LDLCALC, LDLDIRECT ? ?Physical Exam:   ? ?VS:  BP 126/76 (BP Location: Right Arm, Patient Position: Sitting, Cuff Size: Normal)   Pulse 90   Ht 5\' 3"  (1.6 m)   Wt 155 lb 9.6 oz (70.6 kg)   BMI 27.56 kg/m?    ? ?Wt Readings from Last 3 Encounters:  ?01/29/22 156 lb 1.6 oz (70.8 kg)  ?12/15/21 148 lb 1.6 oz (67.2 kg)  ?11/28/21 145 lb (65.8 kg)  ?  ?GEN: Well nourished, well developed in no acute distress ?HEENT: Normal, moist mucous membranes ?NECK: No JVD ?CARDIAC: regular rhythm, normal S1 and S2, no rubs or gallops. No murmur. ?VASCULAR: Radial and DP pulses 2+ bilaterally. No carotid bruits ?RESPIRATORY:  Clear to auscultation without rales, wheezing or rhonchi   ?ABDOMEN: Soft, non-tender, non-distended ?MUSCULOSKELETAL:  Ambulates independently ?SKIN: Warm and dry, no edema ?NEUROLOGIC:  Alert and oriented x 3. No focal neuro deficits noted. ?PSYCHIATRIC:  Normal affect   ? ?ASSESSMENT:   ? ?1. Palpitation   ?2. History of COVID-19   ?3. Cardiac risk counseling   ?4. Counseling on health promotion and disease prevention   ? ?PLAN:   ? ?History of Covid ?Palpitations ?-Zio monitor ordered. If significantly abnormal, then will get echo ?-ecg unremarkable ? ?Cardiac risk counseling and prevention recommendations: ?-recommend heart healthy/Mediterranean diet, with whole grains, fruits, vegetable, fish, lean meats, nuts, and olive oil. Limit salt. ?-recommend moderate walking, 3-5 times/week for 30-50 minutes each session. Aim for at least 150 minutes.week. Goal should be pace of 3 miles/hours, or walking 1.5 miles in 30 minutes ?-recommend avoidance of tobacco products. Avoid excess alcohol. ?-ASCVD risk score: ?The ASCVD Risk score (Arnett DK, et al., 2019) failed to calculate for the following reasons: ?  The 2019 ASCVD risk score is only valid for ages 75 to 81   ? ?Plan for follow up: ? ?75, MD, PhD, [facility] ?[facility]  [facility]   ? ?Medication Adjustments/Labs and Tests Ordered: ?Current medicines are reviewed at length with the patient today.  Concerns regarding medicines are outlined above.  ?Orders Placed This Encounter  ?Procedures  ? LONG TERM MONITOR (3-14 DAYS)  ? EKG 12-Lead  ? ?No orders of the defined types were placed in this encounter. ? ? ?Patient Instructions  ?Medication Instructions:  ?Your Physician recommend you continue on your current medication as directed.   ? ?*If you need a refill on your cardiac medications before your next appointment, please call your pharmacy* ? ? ?Lab Work: ?None ordered today ? ? ?Testing/Procedures: ?Your physician has recommended that you wear a 14 day Zio monitor.  ? ?This monitor is a medical  device that records the heart?s electrical activity. Doctors most often use these monitors to diagnose arrhythmias. Arrhythmias are problems with the speed or rhythm of the heartbeat. The monitor is a small device a

## 2022-02-02 NOTE — Patient Instructions (Signed)
Medication Instructions:  ?Your Physician recommend you continue on your current medication as directed.   ? ?*If you need a refill on your cardiac medications before your next appointment, please call your pharmacy* ? ? ?Lab Work: ?None ordered today ? ? ?Testing/Procedures: ?Your physician has recommended that you wear a 14 day Zio monitor.  ? ?This monitor is a medical device that records the heart?s electrical activity. Doctors most often use these monitors to diagnose arrhythmias. Arrhythmias are problems with the speed or rhythm of the heartbeat. The monitor is a small device applied to your chest. You can wear one while you do your normal daily activities. While wearing this monitor if you have any symptoms to push the button and record what you felt. Once you have worn this monitor for the period of time provider prescribed (Usually 14 days), you will return the monitor device in the postage paid box. Once it is returned they will download the data collected and provide [place] with a report which the provider will then review and we will call you with those results. Important tips: ? ?Avoid showering during the first 24 hours of wearing the monitor. ?Avoid excessive sweating to help maximize wear time. ?Do not submerge the device, no hot tubs, and no swimming pools. ?Keep any lotions or oils away from the patch. ?After 24 hours you may shower with the patch on. Take brief showers with your back facing the shower head.  ?Do not remove patch once it has been placed because that will interrupt data and decrease adhesive wear time. ?Push the button when you have any symptoms and write down what you were feeling. ?Once you have completed wearing your monitor, remove and place into box which has postage paid and place in your outgoing mailbox.  ?If for some reason you have misplaced your box then call our office and we can provide another box and/or mail it off for you. ? ?  ? ? ?Follow-Up: ?At [facility], you and  your health needs are our priority.  As part of our continuing mission to provide you with exceptional heart care, we have created designated Provider Care Teams.  These Care Teams include your primary Cardiologist (physician) and Advanced Practice Providers (APPs -  Physician Assistants and Nurse Practitioners) who all work together to provide you with the care you need, when you need it. ? ?We recommend signing up for the patient portal called "MyChart".  Sign up information is provided on this After Visit Summary.  MyChart is used to connect with patients for Virtual Visits (Telemedicine).  Patients are able to view lab/test results, encounter notes, upcoming appointments, etc.  Non-urgent messages can be sent to your provider as well.   ?To learn more about what you can do with MyChart, go to [email or web address].   ? ?Your next appointment:   ?As needed ? ?The format for your next appointment:   ?In Person ? ?Provider:   ?Buford Dresser, MD{ ? ?Important Information About Sugar ? ? ? ? ? ? ?

## 2022-02-04 DIAGNOSIS — B028 Zoster with other complications: Secondary | ICD-10-CM | POA: Insufficient documentation

## 2022-02-04 NOTE — Assessment & Plan Note (Signed)
Continued symptoms associated with recent CAP and allergy exacerbation. Her lungs do sound better at this time, but her congestion and effusion is worse. Suspect this is related to allergy symptoms. Will send treatment for allergies and monitor for new or worsening symptoms. She is afebrile today. Discussed importance of continuation of allergy medication at least through the next few months to help during peak allergy season. She expressed understanding. She will finish all medications and monitor for new or worsening symptoms.  ?Plan to follow-up if she becomes febrile or shortness of breath or new symptoms develop.  ?

## 2022-02-04 NOTE — Assessment & Plan Note (Signed)
Symptoms and presentation consistent with herpes zoster of the eyelid. Will begin treatment today with valacyclovir and send for urgent referral to ophthalmology due to association with eye. Discussed with patient the etiology and importance of ophthalmology follow-up due to increased risk of damage to vision, including blindness that may result from eye infection. She expressed understanding.  ?

## 2022-02-05 ENCOUNTER — Other Ambulatory Visit (HOSPITAL_BASED_OUTPATIENT_CLINIC_OR_DEPARTMENT_OTHER): Payer: Self-pay | Admitting: Nurse Practitioner

## 2022-02-05 ENCOUNTER — Other Ambulatory Visit (HOSPITAL_COMMUNITY): Payer: Self-pay

## 2022-02-05 DIAGNOSIS — N760 Acute vaginitis: Secondary | ICD-10-CM

## 2022-02-05 MED ORDER — METRONIDAZOLE 0.75 % VA GEL
1.0000 | Freq: Every day | VAGINAL | 0 refills | Status: DC
  Filled 2022-02-05: qty 70, 7d supply, fill #0

## 2022-02-13 NOTE — Progress Notes (Signed)
Erroneous encounter. No charge ° °

## 2022-02-22 ENCOUNTER — Other Ambulatory Visit (HOSPITAL_COMMUNITY): Payer: Self-pay

## 2022-03-01 ENCOUNTER — Encounter (HOSPITAL_BASED_OUTPATIENT_CLINIC_OR_DEPARTMENT_OTHER): Payer: Self-pay

## 2022-03-02 NOTE — Telephone Encounter (Signed)
Patient returned monitor to the office because she lost the box. RN took the monitor and placed it in the mail for patient

## 2022-03-07 ENCOUNTER — Other Ambulatory Visit (HOSPITAL_COMMUNITY): Payer: Self-pay

## 2022-03-21 ENCOUNTER — Other Ambulatory Visit (HOSPITAL_COMMUNITY): Payer: Self-pay

## 2022-04-13 ENCOUNTER — Other Ambulatory Visit (HOSPITAL_BASED_OUTPATIENT_CLINIC_OR_DEPARTMENT_OTHER): Payer: Self-pay | Admitting: Nurse Practitioner

## 2022-04-13 DIAGNOSIS — F32A Depression, unspecified: Secondary | ICD-10-CM

## 2022-04-13 DIAGNOSIS — F418 Other specified anxiety disorders: Secondary | ICD-10-CM

## 2022-04-13 DIAGNOSIS — F419 Anxiety disorder, unspecified: Secondary | ICD-10-CM

## 2022-04-18 ENCOUNTER — Other Ambulatory Visit (HOSPITAL_COMMUNITY): Payer: Self-pay

## 2022-04-23 ENCOUNTER — Ambulatory Visit (HOSPITAL_BASED_OUTPATIENT_CLINIC_OR_DEPARTMENT_OTHER): Payer: No Typology Code available for payment source | Admitting: Nurse Practitioner

## 2022-04-23 ENCOUNTER — Other Ambulatory Visit (HOSPITAL_BASED_OUTPATIENT_CLINIC_OR_DEPARTMENT_OTHER): Payer: Self-pay | Admitting: Nurse Practitioner

## 2022-04-23 ENCOUNTER — Telehealth (HOSPITAL_BASED_OUTPATIENT_CLINIC_OR_DEPARTMENT_OTHER): Payer: Self-pay

## 2022-04-23 ENCOUNTER — Other Ambulatory Visit (HOSPITAL_COMMUNITY): Payer: Self-pay

## 2022-04-23 DIAGNOSIS — F418 Other specified anxiety disorders: Secondary | ICD-10-CM

## 2022-04-23 DIAGNOSIS — F32A Depression, unspecified: Secondary | ICD-10-CM

## 2022-04-23 DIAGNOSIS — F419 Anxiety disorder, unspecified: Secondary | ICD-10-CM

## 2022-04-23 NOTE — Telephone Encounter (Signed)
Patient left voicemail on nurse line requesting med refill for anxiety and depression. Patient states she requested this over a month ago and has not received it.   Patient is due for a f/u which she acknowledges and will reschedule as her work schedule allows.

## 2022-04-24 ENCOUNTER — Other Ambulatory Visit (HOSPITAL_BASED_OUTPATIENT_CLINIC_OR_DEPARTMENT_OTHER): Payer: Self-pay | Admitting: Nurse Practitioner

## 2022-04-24 ENCOUNTER — Encounter (HOSPITAL_BASED_OUTPATIENT_CLINIC_OR_DEPARTMENT_OTHER): Payer: Self-pay | Admitting: Nurse Practitioner

## 2022-04-24 ENCOUNTER — Other Ambulatory Visit (HOSPITAL_COMMUNITY): Payer: Self-pay

## 2022-04-24 DIAGNOSIS — F419 Anxiety disorder, unspecified: Secondary | ICD-10-CM

## 2022-04-24 DIAGNOSIS — F418 Other specified anxiety disorders: Secondary | ICD-10-CM

## 2022-04-24 MED ORDER — SERTRALINE HCL 50 MG PO TABS
50.0000 mg | ORAL_TABLET | Freq: Every day | ORAL | 3 refills | Status: DC
  Filled 2022-04-24: qty 30, 30d supply, fill #0

## 2022-04-24 MED ORDER — SERTRALINE HCL 50 MG PO TABS
50.0000 mg | ORAL_TABLET | Freq: Every day | ORAL | 3 refills | Status: DC
  Filled 2022-04-24: qty 90, 90d supply, fill #0

## 2022-04-24 MED ORDER — HYDROXYZINE HCL 50 MG PO TABS
50.0000 mg | ORAL_TABLET | Freq: Every evening | ORAL | 3 refills | Status: DC | PRN
  Filled 2022-04-24 – 2022-05-28 (×2): qty 60, 30d supply, fill #0

## 2022-04-24 NOTE — Telephone Encounter (Signed)
Refills sent to [facility] pharmacy

## 2022-05-12 ENCOUNTER — Other Ambulatory Visit (HOSPITAL_COMMUNITY): Payer: Self-pay

## 2022-05-21 ENCOUNTER — Ambulatory Visit (INDEPENDENT_AMBULATORY_CARE_PROVIDER_SITE_OTHER): Payer: No Typology Code available for payment source | Admitting: Radiology

## 2022-05-21 ENCOUNTER — Encounter: Payer: Self-pay | Admitting: Radiology

## 2022-05-21 ENCOUNTER — Other Ambulatory Visit (HOSPITAL_COMMUNITY)
Admission: RE | Admit: 2022-05-21 | Discharge: 2022-05-21 | Disposition: A | Discharge status: Home / Self Care | Payer: No Typology Code available for payment source | Source: Ambulatory Visit | Attending: Radiology | Admitting: Radiology

## 2022-05-21 ENCOUNTER — Other Ambulatory Visit (HOSPITAL_COMMUNITY): Payer: Self-pay

## 2022-05-21 VITALS — BP 110/72 | Ht 63.0 in | Wt 161.0 lb

## 2022-05-21 DIAGNOSIS — D500 Iron deficiency anemia secondary to blood loss (chronic): Secondary | ICD-10-CM | POA: Diagnosis not present

## 2022-05-21 DIAGNOSIS — R87612 Low grade squamous intraepithelial lesion on cytologic smear of cervix (LGSIL): Secondary | ICD-10-CM | POA: Diagnosis not present

## 2022-05-21 DIAGNOSIS — R635 Abnormal weight gain: Secondary | ICD-10-CM | POA: Diagnosis not present

## 2022-05-21 DIAGNOSIS — Z01419 Encounter for gynecological examination (general) (routine) without abnormal findings: Secondary | ICD-10-CM

## 2022-05-21 DIAGNOSIS — F419 Anxiety disorder, unspecified: Secondary | ICD-10-CM

## 2022-05-21 MED ORDER — ESCITALOPRAM OXALATE 10 MG PO TABS
10.0000 mg | ORAL_TABLET | Freq: Every day | ORAL | 4 refills | Status: DC
  Filled 2022-05-21: qty 90, 90d supply, fill #0
  Filled 2022-07-12: qty 90, 90d supply, fill #1

## 2022-05-21 NOTE — Progress Notes (Signed)
Alyssa Stokes 1990-10-30 [id number]   History:  31 y.o. G2P0 presents for annual exam as a new patient. C/o weight gain, would like HgbA1c checked today. Had normal thyroid panel with PCP this year. She also reports worsening fatigue, has a hx of anemia, taking iron daily. Fatigue and brain fog also began after starting zoloft for anxiety. 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   Gynecologic History Patient's last menstrual period was 05/14/2022 (exact date). Period Cycle (Days): 28 Period Duration (Days): 3 Period Pattern: Regular Menstrual Flow: Heavy Dysmenorrhea: (!) Moderate Dysmenorrhea Symptoms: Cramping Contraception/Family planning: abstinence Sexually active: no Last Pap: 2022. Results were: ASCUS HR HPV +, neg colpo   Obstetric History OB History  Gravida Para Term Preterm AB Living  2 0 0        SAB IAB Ectopic Multiple Live Births          0    # Outcome Date GA Lbr Len/2nd Weight Sex Delivery Anes PTL Lv  2 Gravida           1 Gravida              The following portions of the patient's history were reviewed and updated as appropriate: allergies, current medications, past family history, past medical history, past social history, past surgical history, and problem list.  Review of Systems Pertinent items noted in HPI and remainder of comprehensive ROS otherwise negative.   Past medical history, past surgical history, family history and social history were all reviewed and documented in the EPIC chart.   Exam:  Vitals:   05/21/22 1138  BP: 110/72  Weight: 161 lb (73 kg)  Height: 5\' 3"  (1.6 m)   Body mass index is 28.52 kg/m.  General appearance:  Normal Thyroid:  Symmetrical, normal in size, without palpable masses or nodularity. Respiratory  Auscultation:  Clear without wheezing or rhonchi Cardiovascular  Auscultation:  Regular rate, without rubs, murmurs  or gallops  Edema/varicosities:  Not grossly evident Abdominal  Soft,nontender, without masses, guarding or rebound.  Liver/spleen:  No organomegaly noted  Hernia:  None appreciated  Skin  Inspection:  Grossly normal Breasts: Examined lying and sitting. Scar from fibroadenoma removal right breast  Right: Without masses, retractions, nipple discharge or axillary adenopathy.   Left: Without masses, retractions, nipple discharge or axillary adenopathy. Genitourinary   Inguinal/mons:  Normal without inguinal adenopathy  External genitalia:  Normal appearing vulva with no masses, tenderness, or lesions  BUS/Urethra/Skene's glands:  Normal without masses or exudate  Vagina:  Normal appearing with normal color and discharge, no lesions  Cervix:  Normal appearing without discharge or lesions  Uterus:  Normal in size, shape and contour.  Mobile, nontender  Adnexa/parametria:     Rt: Normal in size, without masses or tenderness.   Lt: Normal in size, without masses or tenderness.  Anus and perineum: Normal   Patient informed chaperone available to be present for breast and pelvic exam. Patient has requested no chaperone to be present. Patient has been advised what will be completed during breast and pelvic exam.   Assessment/Plan:   1. Well woman exam with routine gynecological exam  2. 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 Iron deficiency anemia due to chronic blood loss Continue to have  fatigue despite iron supplementation - CBC - Ferritin - B12 and Folate Panel  4. Weight gain Requests A1c today Weight gain may be due to zoloft use - HgB A1c  5. Anxiety Change from zoloft to lexapro due to fatigue, low libido and weight gain    Discussed SBE, pap and STI screening as directed/appropriate. Recommend of exercise weekly, including weight bearing exercise. Encouraged the use  of seatbelts and sunscreen. Return in 1 year for annual or as needed.   Arlie Solomons B WHNP-BC 12:16 PM 05/21/2022

## 2022-05-22 LAB — CBC
HCT: 39.8 % (ref 35.0–45.0)
Hemoglobin: 13.2 g/dL (ref 11.7–15.5)
MCH: 29.4 pg (ref 27.0–33.0)
MCHC: 33.2 g/dL (ref 32.0–36.0)
MCV: 88.6 fL (ref 80.0–100.0)
MPV: 10.1 fL (ref 7.5–12.5)
Platelets: 305 10*3/uL (ref 140–400)
RBC: 4.49 10*6/uL (ref 3.80–5.10)
RDW: 13.8 % (ref 11.0–15.0)
WBC: 5.9 10*3/uL (ref 3.8–10.8)

## 2022-05-22 LAB — B12 AND FOLATE PANEL
Folate: 11.4 ng/mL
Vitamin B-12: 527 pg/mL (ref 200–1100)

## 2022-05-22 LAB — HEMOGLOBIN A1C
Hgb A1c MFr Bld: 5.6 % of total Hgb (ref <5.7)
Mean Plasma Glucose: 114 mg/dL
eAG (mmol/L): 6.3 mmol/L

## 2022-05-22 LAB — FERRITIN: Ferritin: 80 ng/mL (ref 16–154)

## 2022-05-24 LAB — CYTOLOGY - PAP
Chlamydia: NEGATIVE
Comment: NEGATIVE
Comment: NEGATIVE
Comment: NEGATIVE
Comment: NEGATIVE
Comment: NEGATIVE
Comment: NORMAL
Diagnosis: UNDETERMINED — AB
HPV 16: NEGATIVE
HPV 18 / 45: NEGATIVE
High risk HPV: POSITIVE — AB
Neisseria Gonorrhea: NEGATIVE
Trichomonas: NEGATIVE

## 2022-05-28 ENCOUNTER — Telehealth (HOSPITAL_BASED_OUTPATIENT_CLINIC_OR_DEPARTMENT_OTHER): Payer: Self-pay

## 2022-05-28 ENCOUNTER — Encounter (HOSPITAL_BASED_OUTPATIENT_CLINIC_OR_DEPARTMENT_OTHER): Payer: Self-pay

## 2022-05-28 ENCOUNTER — Ambulatory Visit (HOSPITAL_BASED_OUTPATIENT_CLINIC_OR_DEPARTMENT_OTHER): Payer: No Typology Code available for payment source | Admitting: Nurse Practitioner

## 2022-05-28 ENCOUNTER — Other Ambulatory Visit (HOSPITAL_COMMUNITY): Payer: Self-pay

## 2022-05-28 DIAGNOSIS — L308 Other specified dermatitis: Secondary | ICD-10-CM

## 2022-05-28 DIAGNOSIS — F418 Other specified anxiety disorders: Secondary | ICD-10-CM

## 2022-05-28 DIAGNOSIS — R052 Subacute cough: Secondary | ICD-10-CM

## 2022-05-28 MED ORDER — TRAZODONE HCL 50 MG PO TABS
25.0000 mg | ORAL_TABLET | Freq: Every evening | ORAL | 3 refills | Status: DC | PRN
  Filled 2022-05-28: qty 30, 30d supply, fill #0
  Filled 2022-06-19: qty 30, 30d supply, fill #1
  Filled 2022-07-12 – 2022-07-20 (×2): qty 30, 30d supply, fill #2

## 2022-05-28 MED ORDER — DM-GUAIFENESIN ER 30-600 MG PO TB12
2.0000 | ORAL_TABLET | Freq: Every evening | ORAL | 1 refills | Status: DC | PRN
  Filled 2022-05-28: qty 30, 15d supply, fill #0

## 2022-05-28 MED ORDER — MOMETASONE FUROATE 0.1 % EX CREA
TOPICAL_CREAM | CUTANEOUS | 2 refills | Status: AC
  Filled 2022-05-28: qty 60, 10d supply, fill #0

## 2022-05-28 NOTE — Telephone Encounter (Signed)
Patient would like to try trazadone for sleep, she also still has the bad cough at night and tessalon pearls are not working. ( Maybe try something different). She has a outbreak of eczema on her neck and would like some cream called in.

## 2022-05-29 ENCOUNTER — Other Ambulatory Visit (HOSPITAL_COMMUNITY): Payer: Self-pay

## 2022-05-30 ENCOUNTER — Other Ambulatory Visit (HOSPITAL_COMMUNITY): Payer: Self-pay

## 2022-06-04 ENCOUNTER — Encounter (HOSPITAL_BASED_OUTPATIENT_CLINIC_OR_DEPARTMENT_OTHER): Payer: Self-pay | Admitting: Nurse Practitioner

## 2022-06-04 ENCOUNTER — Other Ambulatory Visit (HOSPITAL_COMMUNITY): Payer: Self-pay

## 2022-06-04 DIAGNOSIS — K219 Gastro-esophageal reflux disease without esophagitis: Secondary | ICD-10-CM

## 2022-06-04 MED ORDER — PANTOPRAZOLE SODIUM 40 MG PO TBEC
40.0000 mg | DELAYED_RELEASE_TABLET | Freq: Two times a day (BID) | ORAL | 3 refills | Status: DC
  Filled 2022-06-04: qty 60, 30d supply, fill #0
  Filled 2022-06-29: qty 60, 30d supply, fill #1

## 2022-06-04 NOTE — Telephone Encounter (Signed)
It sounds like it is acid reflux that is causing the pain. I have sent in pantoprazole to the [facility] pharmacy for her to take twice a day for the next 30 days. She should not take the prilosec while taking this because these are similar medications.

## 2022-06-04 NOTE — Telephone Encounter (Signed)
Trazodone has been sent along with mucinex. I sent the pantoprazole for her today.

## 2022-06-16 ENCOUNTER — Other Ambulatory Visit (HOSPITAL_COMMUNITY): Payer: Self-pay

## 2022-06-16 ENCOUNTER — Ambulatory Visit
Admission: EM | Admit: 2022-06-16 | Discharge: 2022-06-16 | Disposition: A | Discharge status: Home / Self Care | Payer: No Typology Code available for payment source | Attending: Urgent Care | Admitting: Urgent Care

## 2022-06-16 ENCOUNTER — Other Ambulatory Visit (HOSPITAL_BASED_OUTPATIENT_CLINIC_OR_DEPARTMENT_OTHER): Payer: Self-pay | Admitting: Nurse Practitioner

## 2022-06-16 DIAGNOSIS — B349 Viral infection, unspecified: Secondary | ICD-10-CM

## 2022-06-16 DIAGNOSIS — Z1152 Encounter for screening for COVID-19: Secondary | ICD-10-CM | POA: Diagnosis present

## 2022-06-16 DIAGNOSIS — J302 Other seasonal allergic rhinitis: Secondary | ICD-10-CM

## 2022-06-16 DIAGNOSIS — R051 Acute cough: Secondary | ICD-10-CM

## 2022-06-16 DIAGNOSIS — J309 Allergic rhinitis, unspecified: Secondary | ICD-10-CM | POA: Diagnosis present

## 2022-06-16 DIAGNOSIS — Z20822 Contact with and (suspected) exposure to covid-19: Secondary | ICD-10-CM | POA: Diagnosis present

## 2022-06-16 DIAGNOSIS — J4530 Mild persistent asthma, uncomplicated: Secondary | ICD-10-CM | POA: Diagnosis present

## 2022-06-16 MED ORDER — PREDNISONE 20 MG PO TABS
40.0000 mg | ORAL_TABLET | Freq: Every day | ORAL | 0 refills | Status: AC
  Filled 2022-06-16: qty 10, 5d supply, fill #0

## 2022-06-16 MED ORDER — BENZONATATE 100 MG PO CAPS
100.0000 mg | ORAL_CAPSULE | Freq: Three times a day (TID) | ORAL | 0 refills | Status: DC | PRN
  Filled 2022-06-16: qty 60, 10d supply, fill #0

## 2022-06-16 MED ORDER — PROMETHAZINE-DM 6.25-15 MG/5ML PO SYRP
5.0000 mL | ORAL_SOLUTION | Freq: Three times a day (TID) | ORAL | 0 refills | Status: DC | PRN
  Filled 2022-06-16: qty 100, 7d supply, fill #0

## 2022-06-16 MED ORDER — PAXLOVID (300/100) 20 X 150 MG & 10 X 100MG PO TBPK
ORAL_TABLET | ORAL | 0 refills | Status: DC
  Filled 2022-06-16: qty 30, 5d supply, fill #0

## 2022-06-16 NOTE — ED Provider Notes (Addendum)
[facility]  Note:  This document was prepared using Conservation officer, historic buildings and may include unintentional dictation errors.  MRN: [id number] DOB: [birth year]  Subjective:   Alyssa Stokes is a 31 y.o. female presenting for 2-day history of acute onset malaise, fatigue, coughing, shortness of breath and chest discomfort, right ear pain, congestion.  Patient has a history of allergic rhinitis and asthma.  Has been using her albuterol inhaler with minimal relief.  Patient did have very close exposure to COVID-19.  The very next day she felt ill.  She states that the last time she had COVID she did very poorly.  She would like to have COVID antivirals and is okay with obtaining blood work to confirm her kidney function.  She is not a smoker.  Medication review done for drug interactions.  No current facility-administered medications for this encounter.  Current Outpatient Medications:    albuterol (VENTOLIN HFA) 108 (90 Base) MCG/ACT inhaler, Inhale 2 puffs into the lungs every 6 (six) hours as needed for wheezing or shortness of breath., Disp: 8 g, Rfl: 3   Ascorbic Acid (VITAMIN C PO), Take by mouth., Disp: , Rfl:    dextromethorphan-guaiFENesin (MUCINEX DM) 30-600 MG 12hr tablet, Take 2 tablets by mouth at bedtime as needed for cough., Disp: 30 tablet, Rfl: 1   escitalopram (LEXAPRO) 10 MG tablet, Take 1 tablet by mouth daily., Disp: 90 tablet, Rfl: 4   Ferrous Sulfate (IRON PO), Take by mouth., Disp: , Rfl:    levocetirizine (XYZAL) 5 MG tablet, Take 1 tablet (5 mg total) by mouth every evening., Disp: 90 tablet, Rfl: 3   mometasone (ELOCON) 0.1 % cream, Apply thin layer to rash twice a day for no more than 10 days in a row. May repeat after 3 day break if rash returns., Disp: 50 g, Rfl: 2   montelukast (SINGULAIR) 10 MG tablet, Take 1 tablet (10 mg total) by mouth at bedtime., Disp: 30 tablet, Rfl: 3   Multiple Vitamins-Minerals (ZINC PO), Take  by mouth., Disp: , Rfl:    pantoprazole (PROTONIX) 40 MG tablet, Take 1 tablet by mouth 2 times daily for at least 30 days then taper to once a day., Disp: 60 tablet, Rfl: 3   traZODone (DESYREL) 50 MG tablet, Take 1/2 - 1 tablet by mouth at bedtime as needed for sleep., Disp: 30 tablet, Rfl: 3   VITAMIN D PO, Take by mouth., Disp: , Rfl:    Allergies  Allergen Reactions   Ondansetron Hives   Avocado Nausea Only    Tongue tingles Tongue tingles   Banana Nausea Only    Tongue tingled   Benadryl [Diphenhydramine] Palpitations    IV Bendaryl   Zofran [Ondansetron Hcl] Rash    Past Medical History:  Diagnosis Date   Acute left-sided low back pain with left-sided sciatica 11/30/2021   Asthma    Elevated blood-pressure reading without diagnosis of hypertension 02/17/2021   Last Assessment & Plan:  Formatting of this note might be different from the original. Advised patient to do home blood pressure monitoring.  We need to rule out an arrhythmia causing temporary elevations in blood pressure so referral to cardiology made.   Encounter for medical examination to establish care 06/27/2021   Renal disorder      Past Surgical History:  Procedure Laterality Date   BREAST SURGERY      Family History  Problem Relation Age of Onset   Healthy Mother  Asthma Mother    Healthy Father     Social History   Tobacco Use   Smoking status: Never   Smokeless tobacco: Never  Vaping Use   Vaping Use: Never used  Substance Use Topics   Alcohol use: Not Currently    Alcohol/week: 1.0 standard drink of alcohol    Types: 1 Glasses of wine per week   Drug use: Never    ROS   Objective:   Vitals: BP (!) 139/92   Pulse 81   Temp 98.8 F (37.1 C)   Resp 16   LMP 05/11/2022 (Exact Date)   SpO2 96%   Physical Exam Constitutional:      General: She is not in acute distress.    Appearance: Normal appearance. She is well-developed and normal weight. She is ill-appearing. She is not  toxic-appearing or diaphoretic.  HENT:     Head: Normocephalic and atraumatic.     Right Ear: Tympanic membrane, ear canal and external ear normal. No drainage or tenderness. No middle ear effusion. There is no impacted cerumen. Tympanic membrane is not erythematous.     Left Ear: Tympanic membrane, ear canal and external ear normal. No drainage or tenderness.  No middle ear effusion. There is no impacted cerumen. Tympanic membrane is not erythematous.     Nose: Congestion present. No rhinorrhea.     Mouth/Throat:     Mouth: Mucous membranes are moist. No oral lesions.     Pharynx: No pharyngeal swelling, oropharyngeal exudate, posterior oropharyngeal erythema or uvula swelling.     Tonsils: No tonsillar exudate or tonsillar abscesses.  Eyes:     General: No scleral icterus.       Right eye: No discharge.        Left eye: No discharge.     Extraocular Movements: Extraocular movements intact.     Right eye: Normal extraocular motion.     Left eye: Normal extraocular motion.     Conjunctiva/sclera: Conjunctivae normal.  Cardiovascular:     Rate and Rhythm: Normal rate and regular rhythm.     Heart sounds: Normal heart sounds. No murmur heard.    No friction rub. No gallop.  Pulmonary:     Effort: Pulmonary effort is normal. No respiratory distress.     Breath sounds: No stridor. No wheezing, rhonchi or rales.  Chest:     Chest wall: No tenderness.  Musculoskeletal:     Cervical back: Normal range of motion and neck supple.  Lymphadenopathy:     Cervical: No cervical adenopathy.  Skin:    General: Skin is warm and dry.  Neurological:     General: No focal deficit present.     Mental Status: She is alert and oriented to person, place, and time.  Psychiatric:        Mood and Affect: Mood normal.        Behavior: Behavior normal.     Assessment and Plan :   PDMP not reviewed this encounter.  1. Acute viral syndrome   2. Encounter for screening for COVID-19   3. Close  exposure to COVID-19 virus   4. Mild persistent asthma without complication   5. Allergic rhinitis, unspecified seasonality, unspecified trigger   6. Acute cough    COVID-19 testing pending.  We will confirm kidney function through basic metabolic panel.  I went ahead and sent a prescription for Paxlovid proactively as she had close exposure and is symptomatic.  Recommended waiting until she gets her results  for her COVID test.  Use prednisone in the context of the asthma and respiratory symptoms.  In the meantime use supportive care for an acute viral syndrome. Deferred imaging given clear cardiopulmonary exam, hemodynamically stable vital signs. Counseled patient on potential for adverse effects with medications prescribed/recommended today, ER and return-to-clinic precautions discussed, patient verbalized understanding.     Wallis Bamberg, [place] 06/16/22 [phone]

## 2022-06-16 NOTE — Discharge Instructions (Addendum)
We will notify you of your test results as they arrive and may take between about 24 hours.  I encourage you to sign up for MyChart if you have not already done so as this can be the easiest way for [place] to communicate results to you online or through a phone app.  Generally, we only contact you if it is a positive test result.  In the meantime, if you develop worsening symptoms including fever, chest pain, shortness of breath despite our current treatment plan then please report to the emergency room as this may be a sign of worsening status from possible viral infection.  Otherwise, we will manage this as a viral syndrome. For sore throat or cough try using a honey-based tea. Use 3 teaspoons of honey with juice squeezed from half lemon. Place shaved pieces of ginger into 1/2-1 cup of water and warm over stove top. Then mix the ingredients and repeat every 4 hours as needed. Please take Tylenol 500mg -650mg  every 6 hours for aches and pains, fevers. Hydrate very well with at least 2 liters of water. Eat light meals such as soups to replenish electrolytes and soft fruits, veggies. Start an antihistamine like Zyrtec for postnasal drainage, sinus congestion.  You can take this together with prednisone.  Use the cough medications as needed.   I am sending a prescription for Paxlovid (nirmatrelvir & ritonavir) proactively. Please wait until you see your results which will likely show up on mychart first. If your test is positive then go to the pharmacy and fill it so you can start the medication against COVID 19. We will let you know if you need to stop it based off of your blood test results.

## 2022-06-16 NOTE — ED Triage Notes (Signed)
Pt. States she was exposed to [facility] Thursday and since Friday has been experiencing cold like symptoms, headaches, body aches and vomiting. Pt. States she's been treating with tylenol.

## 2022-06-17 LAB — BASIC METABOLIC PANEL
BUN/Creatinine Ratio: 10 (ref 9–23)
BUN: 8 mg/dL (ref 6–20)
CO2: 21 mmol/L (ref 20–29)
Calcium: 10.1 mg/dL (ref 8.7–10.2)
Chloride: 107 mmol/L — ABNORMAL HIGH (ref 96–106)
Creatinine, Ser: 0.78 mg/dL (ref 0.57–1.00)
Glucose: 102 mg/dL — ABNORMAL HIGH (ref 70–99)
Potassium: 4.5 mmol/L (ref 3.5–5.2)
Sodium: 142 mmol/L (ref 134–144)
eGFR: 103 mL/min/{1.73_m2} (ref >59)

## 2022-06-17 LAB — SARS CORONAVIRUS 2 (TAT 6-24 HRS): SARS Coronavirus 2: NEGATIVE

## 2022-06-19 ENCOUNTER — Other Ambulatory Visit (HOSPITAL_COMMUNITY): Payer: Self-pay

## 2022-06-19 MED ORDER — MONTELUKAST SODIUM 10 MG PO TABS
10.0000 mg | ORAL_TABLET | Freq: Every day | ORAL | 3 refills | Status: AC
  Filled 2022-06-19: qty 30, 30d supply, fill #0
  Filled 2022-07-20: qty 30, 30d supply, fill #1
  Filled 2022-08-13: qty 30, 30d supply, fill #2

## 2022-06-22 ENCOUNTER — Ambulatory Visit: Payer: No Typology Code available for payment source | Admitting: Urology

## 2022-06-22 ENCOUNTER — Other Ambulatory Visit (HOSPITAL_COMMUNITY): Payer: Self-pay

## 2022-06-25 ENCOUNTER — Encounter: Payer: Self-pay | Admitting: Emergency Medicine

## 2022-06-25 ENCOUNTER — Ambulatory Visit
Admission: EM | Admit: 2022-06-25 | Discharge: 2022-06-25 | Disposition: A | Discharge status: Home / Self Care | Payer: No Typology Code available for payment source | Attending: Emergency Medicine | Admitting: Emergency Medicine

## 2022-06-25 ENCOUNTER — Other Ambulatory Visit (HOSPITAL_COMMUNITY): Payer: Self-pay

## 2022-06-25 DIAGNOSIS — J020 Streptococcal pharyngitis: Secondary | ICD-10-CM | POA: Diagnosis not present

## 2022-06-25 DIAGNOSIS — J0140 Acute pansinusitis, unspecified: Secondary | ICD-10-CM

## 2022-06-25 LAB — POCT RAPID STREP A (OFFICE): Rapid Strep A Screen: POSITIVE — AB

## 2022-06-25 MED ORDER — IPRATROPIUM BROMIDE 0.06 % NA SOLN
2.0000 | Freq: Four times a day (QID) | NASAL | 0 refills | Status: AC
  Filled 2022-06-25: qty 15, 10d supply, fill #0

## 2022-06-25 MED ORDER — AMOXICILLIN-POT CLAVULANATE 875-125 MG PO TABS
1.0000 | ORAL_TABLET | Freq: Two times a day (BID) | ORAL | 0 refills | Status: AC
  Filled 2022-06-25: qty 20, 10d supply, fill #0

## 2022-06-25 MED ORDER — AEROCHAMBER MV MISC
1 refills | Status: AC
  Filled 2022-06-25: qty 1, fill #0
  Filled 2022-06-29: qty 1, 30d supply, fill #0

## 2022-06-25 MED ORDER — IBUPROFEN 600 MG PO TABS
600.0000 mg | ORAL_TABLET | Freq: Four times a day (QID) | ORAL | 0 refills | Status: AC | PRN
  Filled 2022-06-25: qty 30, 8d supply, fill #0

## 2022-06-25 MED ORDER — PROMETHAZINE-DM 6.25-15 MG/5ML PO SYRP
5.0000 mL | ORAL_SOLUTION | Freq: Four times a day (QID) | ORAL | 0 refills | Status: AC | PRN
Start: 2022-06-25 — End: ?
  Filled 2022-06-25: qty 118, 6d supply, fill #0

## 2022-06-25 NOTE — ED Triage Notes (Signed)
Pt here with severe right ear pain and right sided throat and neck pain x 1 week. Pt also has an acute on chronic dry cough that has been keeping her up at night despite taking Rx for cough.

## 2022-06-25 NOTE — Discharge Instructions (Addendum)
Your strep was positive today.  Finish the Augmentin, even if you feel better.  1 gram of Tylenol and 600 mg ibuprofen together 3-4 times a day as needed for pain.  Make sure you drink plenty of extra fluids.  Some people find salt water gargles and  Traditional Medicinal's "Throat Coat" tea helpful. Take 5 mL of liquid Benadryl and 5 mL of Maalox. Mix it together, and then hold it in your mouth for as long as you can and then swallow. You may do this 4 times a day.    Start Mucinex-D to keep the mucous thin and to decongest you. .   Use a NeilMed sinus rinse with distilled water as often as you want to to reduce nasal congestion. Follow the directions on the box.   Promethazine DM cough syrup as needed for cough, use spacer with your albuterol inhaler.  Go to the ER if you are not better in 24 to 36 hours, or if you get worse to rule out peritonsillar abscess.  Go to www.goodrx.com to look up your medications. This will give you a list of where you can find your prescriptions at the most affordable prices. Or you can ask the pharmacist what the cash price is. This is frequently cheaper than going through insurance.

## 2022-06-25 NOTE — ED Provider Notes (Signed)
HPI  SUBJECTIVE:  Alyssa Stokes is a 31 y.o. female who presents with 1 week of right ear pain/pressure, right-sided sore throat, cough, headache secondary to the ear pain/sore throat.  Patient was seen here on 9/2 with 2 days of malaise, fatigue, shortness of breath, chest discomfort, right ear pain and congestion.  She was started on Paxlovid, and given prednisone.  She states that she has slightly improved, but has continued nasal congestion, thick green rhinorrhea, cough productive of thick green mucus, postnasal drip, and hoarse voice.  States that she is unable to sleep at night secondary to the cough.  no change in hearing, otorrhea, wheezing, shortness of breath, drooling, trismus, sensation of throat swelling shut, neck stiffness.  She tried Excedrin, heating pad, Tylenol.  Last dose of ibuprofen was within 6 hours of evaluation she is on Xyzal year-round.  She has also tried cough syrup, NyQuil, ibuprofen in addition to Paxlovid and prednisone.  The prednisone helps.  Symptoms are worse with lying down on her right side and with swallowing. Past medical history of allergic rhinitis and asthma.  She has not used her inhaler.  Past Medical History:  Diagnosis Date   Acute left-sided low back pain with left-sided sciatica 11/30/2021   Asthma    Elevated blood-pressure reading without diagnosis of hypertension 02/17/2021   Last Assessment & Plan:  Formatting of this note might be different from the original. Advised patient to do home blood pressure monitoring.  We need to rule out an arrhythmia causing temporary elevations in blood pressure so referral to cardiology made.   Encounter for medical examination to establish care 06/27/2021   Renal disorder     Past Surgical History:  Procedure Laterality Date   BREAST SURGERY      Family History  Problem Relation Age of Onset   Healthy Mother    Asthma Mother    Healthy Father     Social History   Tobacco Use   Smoking  status: Never   Smokeless tobacco: Never  Vaping Use   Vaping Use: Never used  Substance Use Topics   Alcohol use: Not Currently    Alcohol/week: 1.0 standard drink of alcohol    Types: 1 Glasses of wine per week   Drug use: Never    No current facility-administered medications for this encounter.  Current Outpatient Medications:    amoxicillin-clavulanate (AUGMENTIN) 875-125 MG tablet, Take 1 tablet by mouth every 12 (twelve) hours for 10 days., Disp: 20 tablet, Rfl: 0   ibuprofen (ADVIL) 600 MG tablet, Take 1 tablet (600 mg total) by mouth every 6 (six) hours as needed., Disp: 30 tablet, Rfl: 0   ipratropium (ATROVENT) 0.06 % nasal spray, Place 2 sprays into both nostrils 4 (four) times daily., Disp: 15 mL, Rfl: 0   promethazine-dextromethorphan (PROMETHAZINE-DM) 6.25-15 MG/5ML syrup, Take 5 mLs by mouth 4 (four) times daily as needed for cough., Disp: 118 mL, Rfl: 0   Spacer/Aero-Holding Chambers (AEROCHAMBER MV) inhaler, Use as instructed, Disp: 1 each, Rfl: 1   albuterol (VENTOLIN HFA) 108 (90 Base) MCG/ACT inhaler, Inhale 2 puffs into the lungs every 6 (six) hours as needed for wheezing or shortness of breath., Disp: 8 g, Rfl: 3   Ascorbic Acid (VITAMIN C PO), Take by mouth., Disp: , Rfl:    escitalopram (LEXAPRO) 10 MG tablet, Take 1 tablet by mouth daily., Disp: 90 tablet, Rfl: 4   Ferrous Sulfate (IRON PO), Take by mouth., Disp: , Rfl:    levocetirizine (  XYZAL) 5 MG tablet, Take 1 tablet (5 mg total) by mouth every evening., Disp: 90 tablet, Rfl: 3   mometasone (ELOCON) 0.1 % cream, Apply thin layer to rash twice a day for no more than 10 days in a row. May repeat after 3 day break if rash returns., Disp: 50 g, Rfl: 2   montelukast (SINGULAIR) 10 MG tablet, Take 1 tablet (10 mg total) by mouth at bedtime., Disp: 30 tablet, Rfl: 3   Multiple Vitamins-Minerals (ZINC PO), Take by mouth., Disp: , Rfl:    pantoprazole (PROTONIX) 40 MG tablet, Take 1 tablet by mouth 2 times daily for at  least 30 days then taper to once a day., Disp: 60 tablet, Rfl: 3   predniSONE (DELTASONE) 20 MG tablet, Take 2 tablets by mouth daily with breakfast., Disp: 10 tablet, Rfl: 0   traZODone (DESYREL) 50 MG tablet, Take 1/2 - 1 tablet by mouth at bedtime as needed for sleep., Disp: 30 tablet, Rfl: 3   VITAMIN D PO, Take by mouth., Disp: , Rfl:   Allergies  Allergen Reactions   Ondansetron Hives   Avocado Nausea Only    Tongue tingles Tongue tingles   Banana Nausea Only    Tongue tingled   Benadryl [Diphenhydramine] Palpitations    IV Bendaryl   Zofran [Ondansetron Hcl] Rash     ROS  As noted in HPI.   Physical Exam  BP (!) 130/90   Pulse 94   Temp 99.2 F (37.3 C)   Resp 20   LMP 05/11/2022 (Exact Date)   SpO2 99%   Constitutional: Well developed, well nourished, no acute distress Eyes:  EOMI, conjunctiva normal bilaterally HENT: Normocephalic, atraumatic,mucus membranes moist.  Pain with traction on right pinna, tenderness at right TMJ.  Right EAC, TM normal.  No tenderness over the right mastoid.  Left TM normal.  Hearing intact and equal.  Purulent nasal congestion.  No maxillary, frontal sinus tenderness.  Erythematous oropharynx right side with swollen tonsil.  Some fullness along soft palate.  No appreciable uvular swelling or shift.  No exudates.  Positive cobblestoning.  No appreciable postnasal drip.  No drooling, trismus, muffled voice. Neck: Positive right-sided cervical lymphadenopathy.  No meningismus. Respiratory: Normal inspiratory effort, lungs clear bilaterally Cardiovascular: Normal rate, regular rhythm, no murmurs GI: nondistended soft, nontender, no splenomegaly skin: No rash, skin intact Musculoskeletal: no deformities Neurologic: Alert & oriented x 3, no focal neuro deficits Psychiatric: Speech and behavior appropriate   ED Course   Medications - No data to display  Orders Placed This Encounter  Procedures   POCT rapid strep A    Standing  Status:   Standing    Number of Occurrences:   1    Results for orders placed or performed during the hospital encounter of 06/25/22 (from the past 24 hour(s))  POCT rapid strep A     Status: Abnormal   Collection Time: 06/25/22 10:06 AM  Result Value Ref Range   Rapid Strep A Screen Positive (A) Negative   No results found.  ED Clinical Impression  1. Strep pharyngitis   2. Acute non-recurrent pansinusitis      ED Assessment/Plan     Previous records reviewed.  As noted in HPI.  Patient's strep is positive.  I am concerned that she may have a very early peritonsillar abscess, however, her uvula is midline.  Will send home with Augmentin for 10 days, Atrovent nasal spray, Tylenol/ibuprofen, saline nasal irrigation, Mucinex D.  She also  appears to have a sinusitis.  Augmentin will cover this.  Will prescribe a spacer for her albuterol inhaler to use as needed.  She is to go to the ER if not better in 24 hours to evaluate for peritonsillar abscess.  Benadryl/Maalox mixture for the sore throat Promethazine DM for cough.  Discussed labs,  MDM, treatment plan, and plan for follow-up with patient. Discussed sn/sx that should prompt return to the ED. patient agrees with plan.   Meds ordered this encounter  Medications   amoxicillin-clavulanate (AUGMENTIN) 875-125 MG tablet    Sig: Take 1 tablet by mouth every 12 (twelve) hours for 10 days.    Dispense:  20 tablet    Refill:  0   ipratropium (ATROVENT) 0.06 % nasal spray    Sig: Place 2 sprays into both nostrils 4 (four) times daily.    Dispense:  15 mL    Refill:  0   ibuprofen (ADVIL) 600 MG tablet    Sig: Take 1 tablet (600 mg total) by mouth every 6 (six) hours as needed.    Dispense:  30 tablet    Refill:  0   promethazine-dextromethorphan (PROMETHAZINE-DM) 6.25-15 MG/5ML syrup    Sig: Take 5 mLs by mouth 4 (four) times daily as needed for cough.    Dispense:  118 mL    Refill:  0   Spacer/Aero-Holding Chambers  (AEROCHAMBER MV) inhaler    Sig: Use as instructed    Dispense:  1 each    Refill:  1      *This clinic note was created using Dragon dictation software. Therefore, there may be occasional mistakes despite careful proofreading.  ?    Domenick Gong, MD 06/26/22 [phone]

## 2022-06-29 ENCOUNTER — Other Ambulatory Visit (HOSPITAL_COMMUNITY): Payer: Self-pay

## 2022-07-09 ENCOUNTER — Other Ambulatory Visit (HOSPITAL_COMMUNITY): Payer: Self-pay | Admitting: Emergency Medicine

## 2022-07-09 ENCOUNTER — Other Ambulatory Visit (HOSPITAL_COMMUNITY): Payer: Self-pay

## 2022-07-13 ENCOUNTER — Other Ambulatory Visit (HOSPITAL_COMMUNITY): Payer: Self-pay

## 2022-07-13 ENCOUNTER — Encounter (HOSPITAL_BASED_OUTPATIENT_CLINIC_OR_DEPARTMENT_OTHER): Payer: Self-pay | Admitting: Nurse Practitioner

## 2022-07-20 ENCOUNTER — Other Ambulatory Visit (HOSPITAL_COMMUNITY): Payer: Self-pay | Admitting: Emergency Medicine

## 2022-07-20 ENCOUNTER — Other Ambulatory Visit (HOSPITAL_COMMUNITY): Payer: Self-pay

## 2022-07-27 ENCOUNTER — Ambulatory Visit (HOSPITAL_BASED_OUTPATIENT_CLINIC_OR_DEPARTMENT_OTHER): Payer: No Typology Code available for payment source | Admitting: Nurse Practitioner

## 2022-08-02 ENCOUNTER — Other Ambulatory Visit (HOSPITAL_COMMUNITY): Payer: Self-pay

## 2022-08-02 ENCOUNTER — Ambulatory Visit (HOSPITAL_BASED_OUTPATIENT_CLINIC_OR_DEPARTMENT_OTHER): Payer: No Typology Code available for payment source | Admitting: Nurse Practitioner

## 2022-08-02 ENCOUNTER — Other Ambulatory Visit (HOSPITAL_BASED_OUTPATIENT_CLINIC_OR_DEPARTMENT_OTHER): Payer: Self-pay

## 2022-08-02 DIAGNOSIS — F419 Anxiety disorder, unspecified: Secondary | ICD-10-CM

## 2022-08-02 DIAGNOSIS — F418 Other specified anxiety disorders: Secondary | ICD-10-CM

## 2022-08-02 DIAGNOSIS — K219 Gastro-esophageal reflux disease without esophagitis: Secondary | ICD-10-CM

## 2022-08-02 MED ORDER — ESCITALOPRAM OXALATE 10 MG PO TABS
10.0000 mg | ORAL_TABLET | Freq: Every day | ORAL | 0 refills | Status: AC
  Filled 2022-08-02 – 2022-08-13 (×2): qty 90, 90d supply, fill #0

## 2022-08-02 MED ORDER — PANTOPRAZOLE SODIUM 40 MG PO TBEC
40.0000 mg | DELAYED_RELEASE_TABLET | Freq: Two times a day (BID) | ORAL | 0 refills | Status: AC
  Filled 2022-08-02: qty 90, 45d supply, fill #0
  Filled 2022-08-13: qty 90, 60d supply, fill #0

## 2022-08-02 MED ORDER — TRAZODONE HCL 50 MG PO TABS
50.0000 mg | ORAL_TABLET | Freq: Every evening | ORAL | 0 refills | Status: AC | PRN
  Filled 2022-08-02 – 2022-08-13 (×2): qty 90, 90d supply, fill #0

## 2022-08-02 NOTE — Progress Notes (Deleted)
  Worthy Keeler, DNP, AGNP-c [address] [place], [place] [address] [phone] Office [phone] Fax  ESTABLISHED PATIENT- Chronic Health and/or Follow-Up Visit  There were no vitals taken for this visit.    Alyssa Stokes is a 31 y.o. year old female presenting today for evaluation and management of the following: No chief complaint on file.     All ROS negative with exception of what is listed above.   PHYSICAL EXAM Physical Exam  PLAN Problem List Items Addressed This Visit   None   No follow-ups on file.   Worthy Keeler, DNP, AGNP-c

## 2022-08-11 ENCOUNTER — Other Ambulatory Visit (HOSPITAL_COMMUNITY): Payer: Self-pay

## 2022-08-13 ENCOUNTER — Other Ambulatory Visit (HOSPITAL_COMMUNITY): Payer: Self-pay

## 2022-08-16 ENCOUNTER — Other Ambulatory Visit (HOSPITAL_COMMUNITY): Payer: Self-pay

## 2022-08-16 ENCOUNTER — Encounter (HOSPITAL_COMMUNITY): Payer: Self-pay

## 2022-09-16 ENCOUNTER — Other Ambulatory Visit (HOSPITAL_BASED_OUTPATIENT_CLINIC_OR_DEPARTMENT_OTHER): Payer: Self-pay | Admitting: Nurse Practitioner

## 2022-09-16 DIAGNOSIS — F419 Anxiety disorder, unspecified: Secondary | ICD-10-CM

## 2022-09-16 DIAGNOSIS — K219 Gastro-esophageal reflux disease without esophagitis: Secondary | ICD-10-CM

## 2022-09-16 DIAGNOSIS — F418 Other specified anxiety disorders: Secondary | ICD-10-CM

## 2022-09-17 ENCOUNTER — Other Ambulatory Visit (HOSPITAL_BASED_OUTPATIENT_CLINIC_OR_DEPARTMENT_OTHER): Payer: Self-pay | Admitting: Nurse Practitioner

## 2022-09-17 ENCOUNTER — Other Ambulatory Visit (HOSPITAL_COMMUNITY): Payer: Self-pay

## 2022-09-29 ENCOUNTER — Other Ambulatory Visit (HOSPITAL_COMMUNITY): Payer: Self-pay

## 2023-08-05 IMAGING — DX DG WRIST COMPLETE 3+V*R*
4 series · 4 of 4 positions shown · non-contrast
Comparison: None.

CLINICAL DATA: Persistent pain from a child bite to right thumb
01/02/2022. Pain radiating to wrist

EXAM:
RIGHT WRIST - COMPLETE 3+ VIEW; RIGHT THUMB 2+V

[wrist pa]
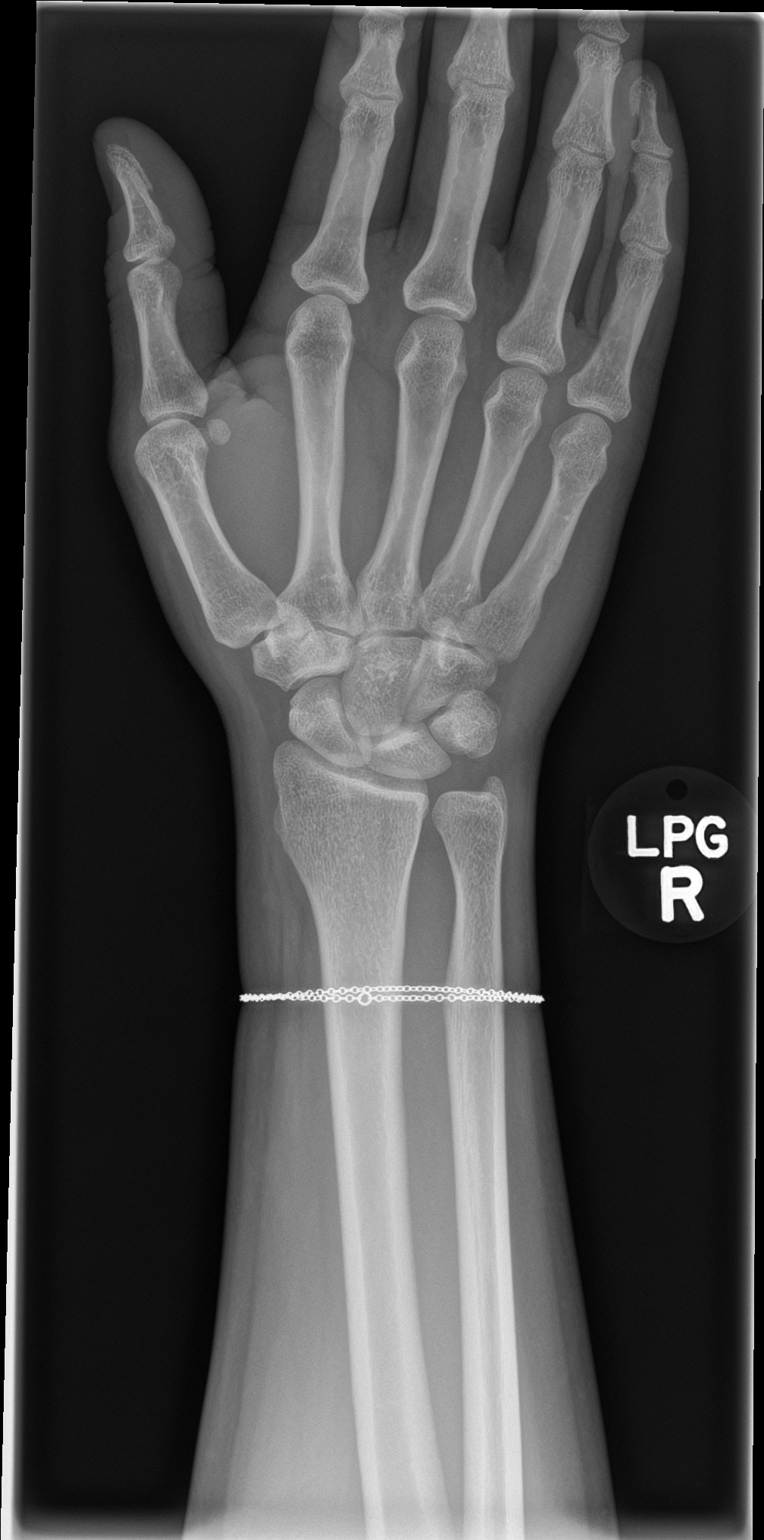

[wrist lat]
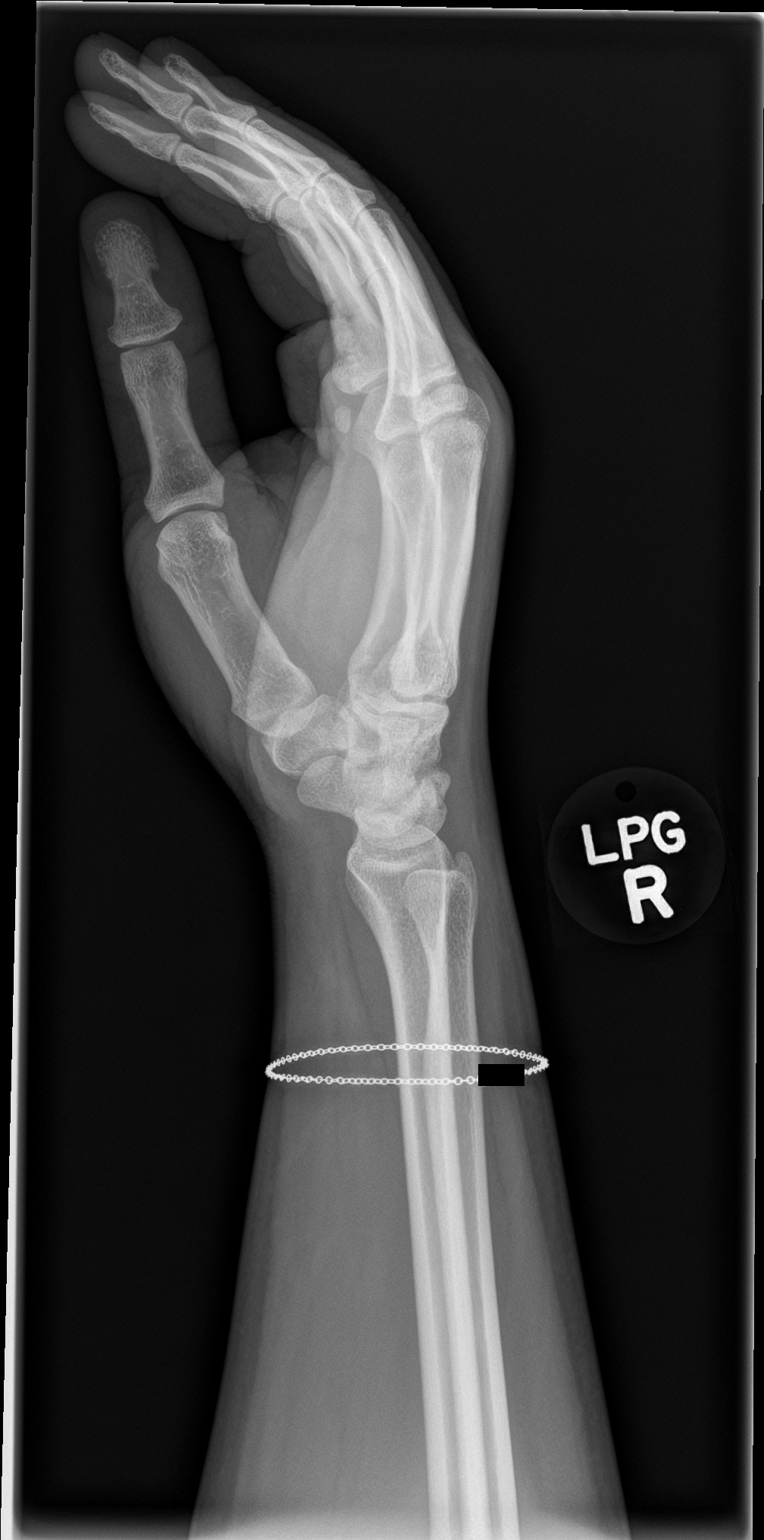

[wrist obl]
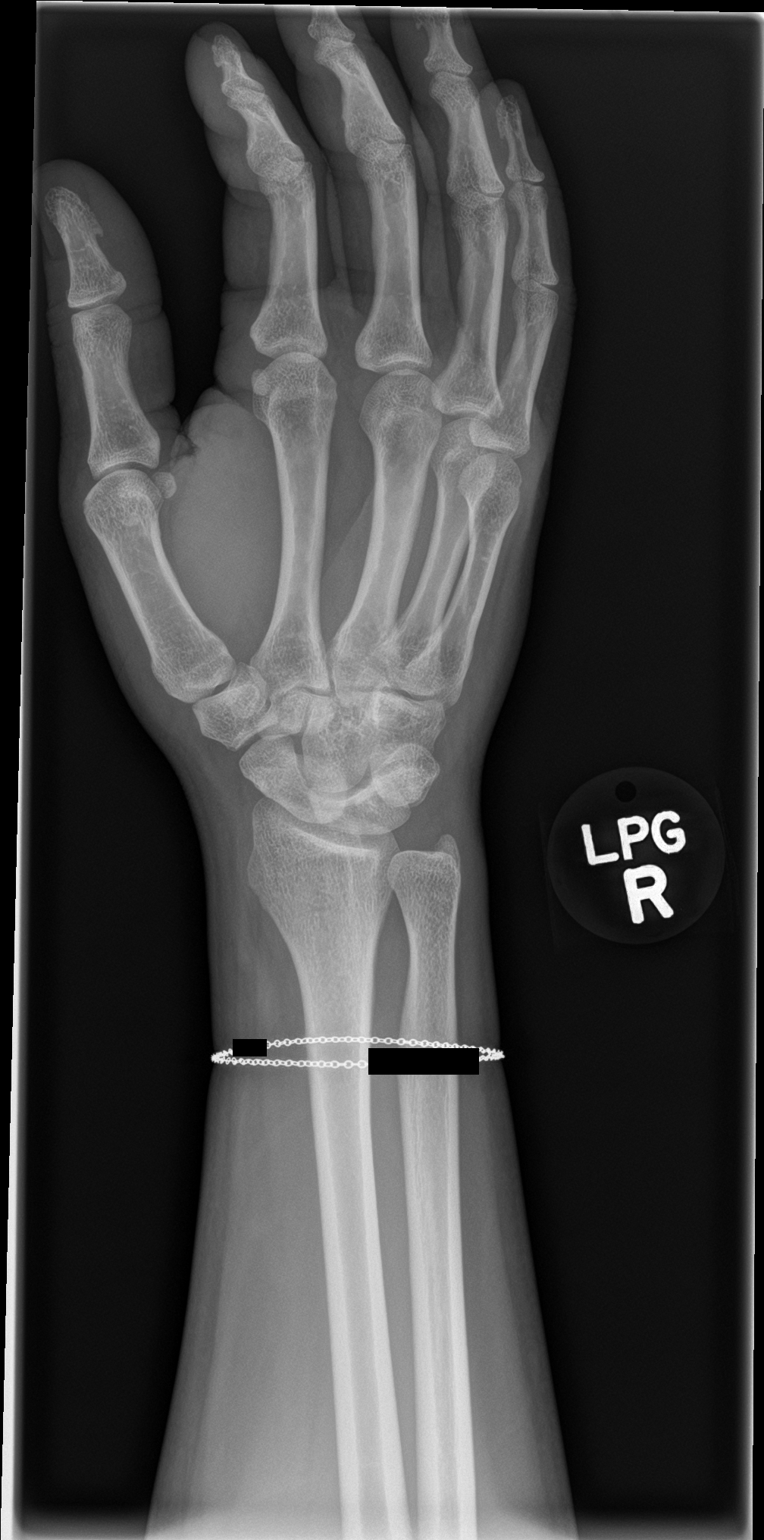

[scaphoid]
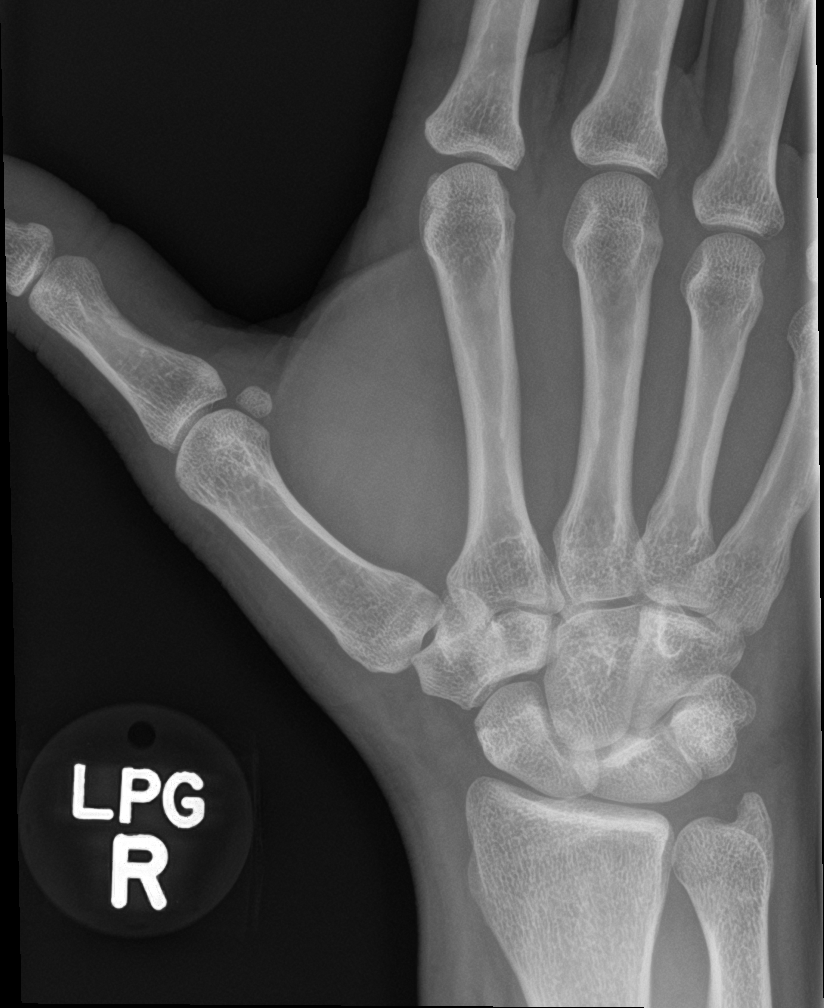

[4 of 4 positions shown; findings below may reference images not displayed]

FINDINGS: There is no evidence of fracture or dislocation. There is no
evidence of arthropathy or other focal bone abnormality. Soft
tissues are unremarkable.
IMPRESSION: No acute osseous injury of the right thumb.

No acute osseous injury of the right wrist.

## 2023-08-05 IMAGING — DX DG FINGER THUMB 2+V*R*
3 series · 3 of 3 positions shown · non-contrast
Comparison: None.

CLINICAL DATA: Persistent pain from a child bite to right thumb
01/02/2022. Pain radiating to wrist

EXAM:
RIGHT WRIST - COMPLETE 3+ VIEW; RIGHT THUMB 2+V

[finger ap]
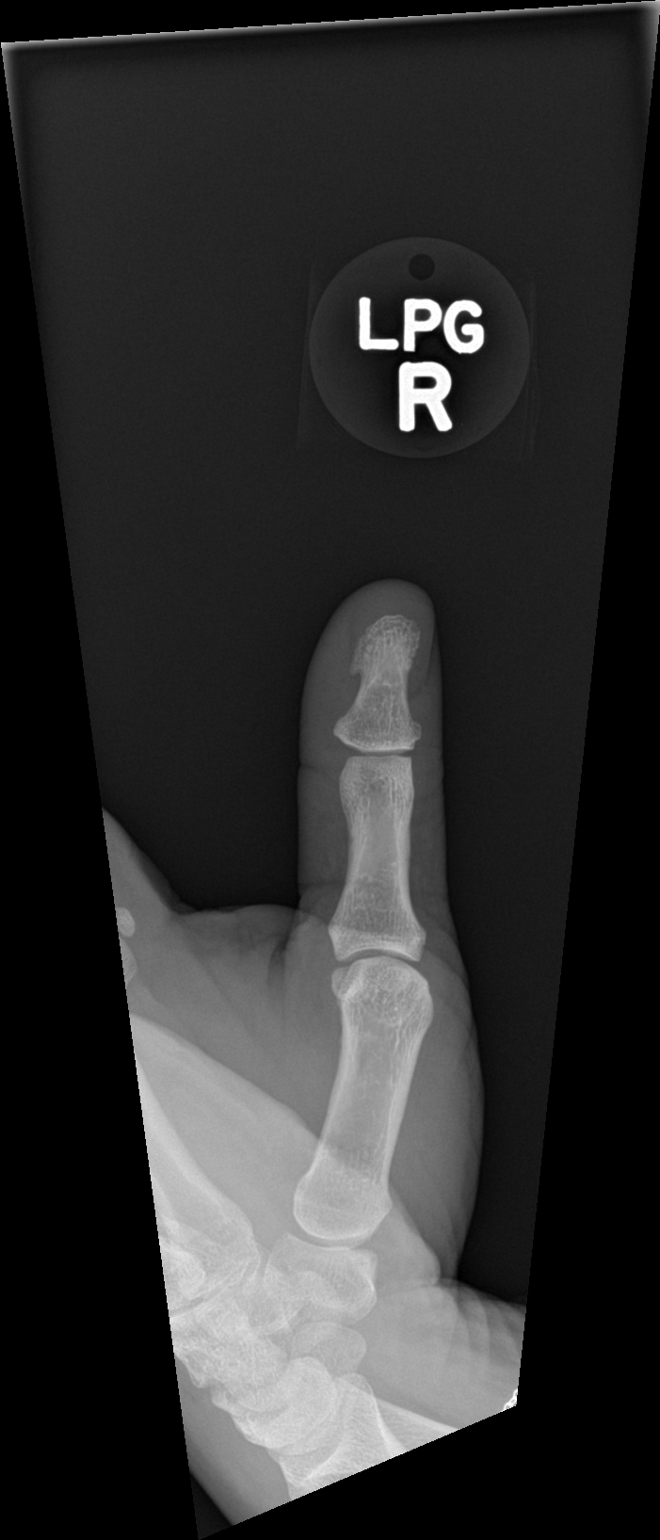

[finger obl]
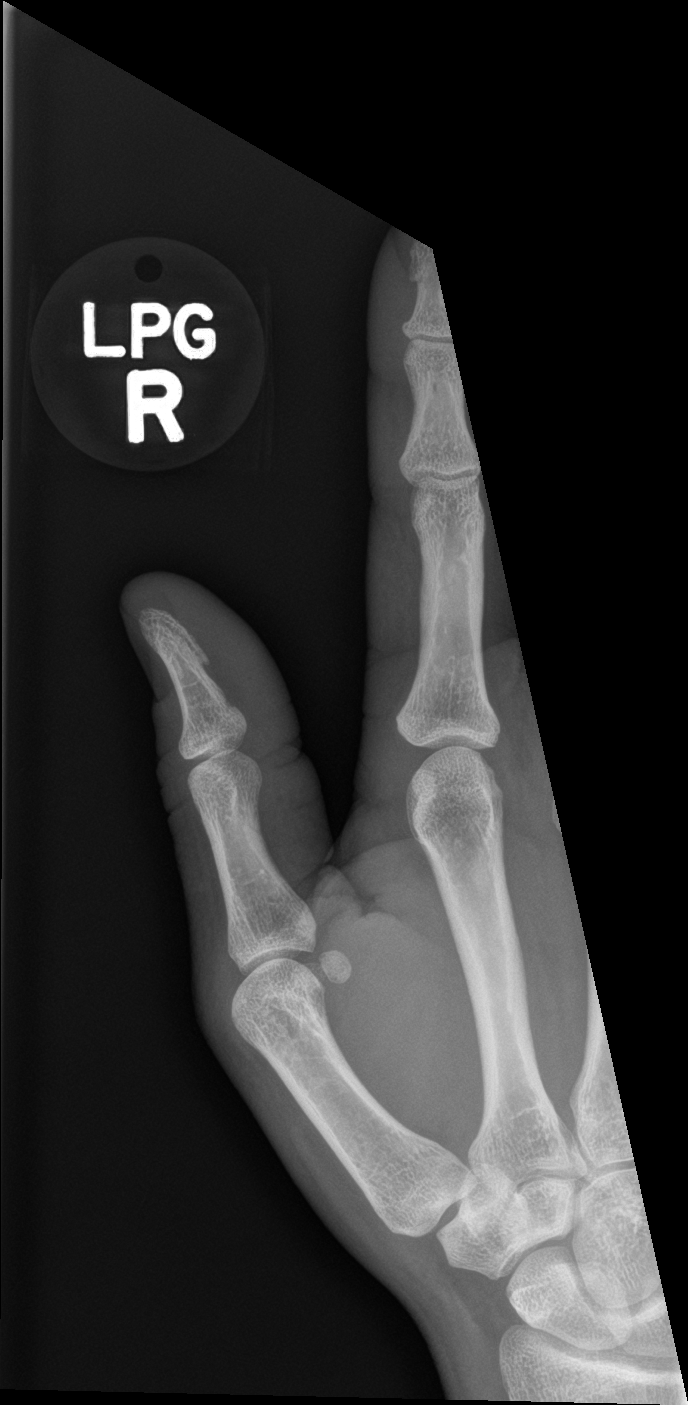

[finger lat]
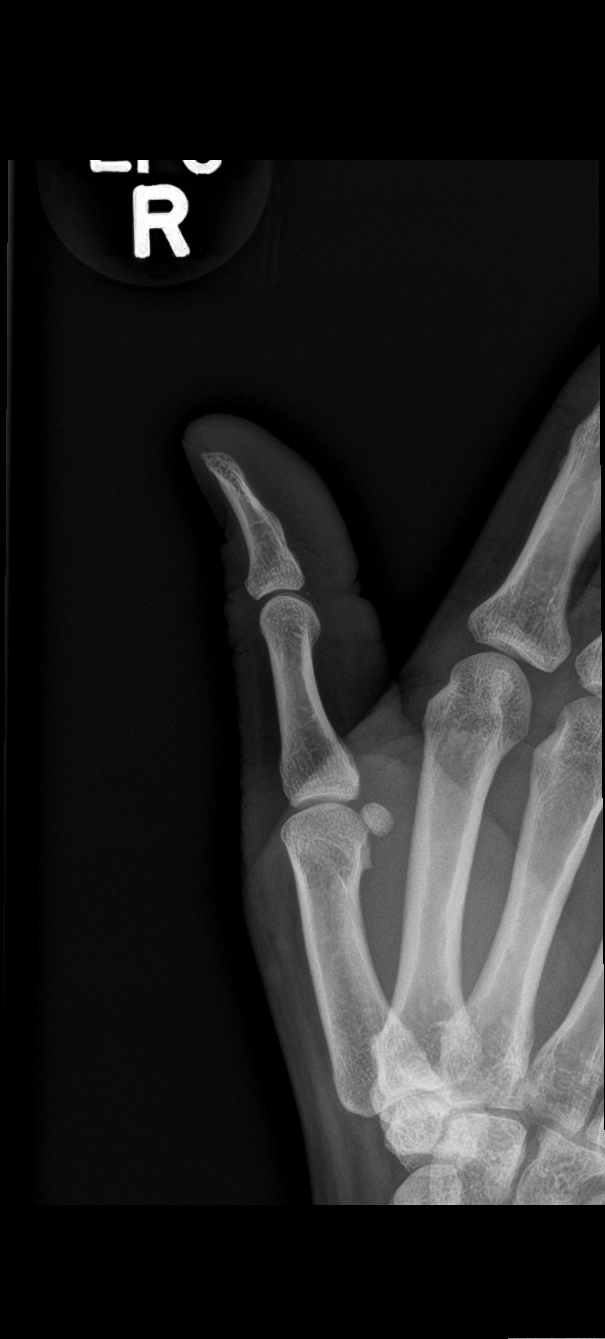

[3 of 3 positions shown; findings below may reference images not displayed]

FINDINGS: There is no evidence of fracture or dislocation. There is no
evidence of arthropathy or other focal bone abnormality. Soft
tissues are unremarkable.
IMPRESSION: No acute osseous injury of the right thumb.

No acute osseous injury of the right wrist.
